# Patient Record
Sex: Male | Born: 1970 | Race: White | Hispanic: No | Marital: Married | Smoking: Never smoker
Health system: Southern US, Community
[De-identification: ages and names within clinical notes are randomized; demographics above are authoritative.]

## PROBLEM LIST (undated history)

## (undated) DIAGNOSIS — F32A Depression, unspecified: Secondary | ICD-10-CM

## (undated) DIAGNOSIS — G4730 Sleep apnea, unspecified: Secondary | ICD-10-CM

## (undated) DIAGNOSIS — T8859XA Other complications of anesthesia, initial encounter: Secondary | ICD-10-CM

## (undated) DIAGNOSIS — G43909 Migraine, unspecified, not intractable, without status migrainosus: Secondary | ICD-10-CM

## (undated) DIAGNOSIS — IMO0001 Reserved for inherently not codable concepts without codable children: Secondary | ICD-10-CM

## (undated) DIAGNOSIS — F329 Major depressive disorder, single episode, unspecified: Secondary | ICD-10-CM

## (undated) DIAGNOSIS — J189 Pneumonia, unspecified organism: Secondary | ICD-10-CM

## (undated) DIAGNOSIS — E162 Hypoglycemia, unspecified: Secondary | ICD-10-CM

## (undated) DIAGNOSIS — R0681 Apnea, not elsewhere classified: Secondary | ICD-10-CM

## (undated) DIAGNOSIS — M549 Dorsalgia, unspecified: Secondary | ICD-10-CM

## (undated) DIAGNOSIS — T7840XA Allergy, unspecified, initial encounter: Secondary | ICD-10-CM

## (undated) DIAGNOSIS — J329 Chronic sinusitis, unspecified: Secondary | ICD-10-CM

## (undated) DIAGNOSIS — T4145XA Adverse effect of unspecified anesthetic, initial encounter: Secondary | ICD-10-CM

## (undated) DIAGNOSIS — G8929 Other chronic pain: Secondary | ICD-10-CM

## (undated) DIAGNOSIS — E8801 Alpha-1-antitrypsin deficiency: Secondary | ICD-10-CM

## (undated) DIAGNOSIS — G2581 Restless legs syndrome: Secondary | ICD-10-CM

## (undated) DIAGNOSIS — T849XXA Unspecified complication of internal orthopedic prosthetic device, implant and graft, initial encounter: Principal | ICD-10-CM

## (undated) HISTORY — PX: SPINAL FUSION: SHX223

## (undated) HISTORY — DX: Restless legs syndrome: G25.81

## (undated) HISTORY — PX: CHOLECYSTECTOMY: SHX55

## (undated) HISTORY — DX: Reserved for inherently not codable concepts without codable children: IMO0001

## (undated) HISTORY — PX: LUMBAR FUSION: SHX111

## (undated) HISTORY — DX: Chronic sinusitis, unspecified: J32.9

## (undated) HISTORY — DX: Allergy, unspecified, initial encounter: T78.40XA

## (undated) HISTORY — PX: BACK SURGERY: SHX140

## (undated) HISTORY — DX: Apnea, not elsewhere classified: R06.81

## (undated) HISTORY — DX: Unspecified complication of internal orthopedic prosthetic device, implant and graft, initial encounter: T84.9XXA

---

## 1998-06-22 ENCOUNTER — Encounter: Admission: RE | Admit: 1998-06-22 | Discharge: 1998-09-20 | Payer: Self-pay | Admitting: Internal Medicine

## 1999-01-11 ENCOUNTER — Encounter: Admission: RE | Admit: 1999-01-11 | Discharge: 1999-04-11 | Payer: Self-pay | Admitting: Internal Medicine

## 2002-07-09 ENCOUNTER — Ambulatory Visit (HOSPITAL_BASED_OUTPATIENT_CLINIC_OR_DEPARTMENT_OTHER): Admission: RE | Admit: 2002-07-09 | Discharge: 2002-07-09 | Payer: Self-pay | Admitting: Otolaryngology

## 2004-04-30 ENCOUNTER — Ambulatory Visit: Payer: Self-pay | Admitting: Family Medicine

## 2006-11-14 ENCOUNTER — Telehealth (INDEPENDENT_AMBULATORY_CARE_PROVIDER_SITE_OTHER): Payer: Self-pay | Admitting: *Deleted

## 2006-11-21 ENCOUNTER — Ambulatory Visit: Payer: Self-pay | Admitting: Family Medicine

## 2006-11-21 ENCOUNTER — Telehealth (INDEPENDENT_AMBULATORY_CARE_PROVIDER_SITE_OTHER): Payer: Self-pay | Admitting: *Deleted

## 2006-11-21 DIAGNOSIS — J309 Allergic rhinitis, unspecified: Secondary | ICD-10-CM | POA: Insufficient documentation

## 2006-11-21 DIAGNOSIS — J45909 Unspecified asthma, uncomplicated: Secondary | ICD-10-CM | POA: Insufficient documentation

## 2006-11-21 LAB — CONVERTED CEMR LAB
ALT: 184 units/L — ABNORMAL HIGH (ref 0–53)
AST: 104 units/L — ABNORMAL HIGH (ref 0–37)
BUN: 13 mg/dL (ref 6–23)
Basophils Absolute: 0 10*3/uL (ref 0.0–0.1)
Calcium: 9.1 mg/dL (ref 8.4–10.5)
Eosinophils Absolute: 0.2 10*3/uL (ref 0.0–0.6)
GFR calc Af Amer: 74 mL/min
HCT: 47.7 % (ref 39.0–52.0)
Hemoglobin: 16.5 g/dL (ref 13.0–17.0)
Lymphocytes Relative: 39.3 % (ref 12.0–46.0)
MCHC: 34.6 g/dL (ref 30.0–36.0)
Mono Screen: NEGATIVE
Neutro Abs: 3.1 10*3/uL (ref 1.4–7.7)
Potassium: 4.3 meq/L (ref 3.5–5.1)
RBC: 5.59 M/uL (ref 4.22–5.81)
Sodium: 140 meq/L (ref 135–145)
TSH: 2.21 microintl units/mL (ref 0.35–5.50)
Total Bilirubin: 1.6 mg/dL — ABNORMAL HIGH (ref 0.3–1.2)
WBC: 6.9 10*3/uL (ref 4.5–10.5)

## 2006-11-22 ENCOUNTER — Telehealth: Payer: Self-pay | Admitting: Family Medicine

## 2007-03-26 ENCOUNTER — Encounter: Payer: Self-pay | Admitting: Internal Medicine

## 2007-04-18 ENCOUNTER — Encounter: Payer: Self-pay | Admitting: Internal Medicine

## 2007-09-19 ENCOUNTER — Ambulatory Visit: Payer: Self-pay | Admitting: Internal Medicine

## 2007-09-19 LAB — CONVERTED CEMR LAB
ALT: 44 units/L (ref 0–53)
Albumin: 3.9 g/dL (ref 3.5–5.2)
Alkaline Phosphatase: 91 units/L (ref 39–117)
Basophils Relative: 0 % (ref 0.0–3.0)
Bilirubin, Direct: 0.1 mg/dL (ref 0.0–0.3)
Calcium: 9.2 mg/dL (ref 8.4–10.5)
Cholesterol: 175 mg/dL (ref 0–200)
Creatinine, Ser: 1.2 mg/dL (ref 0.4–1.5)
GFR calc Af Amer: 88 mL/min
Glucose, Urine, Semiquant: NEGATIVE
Lymphocytes Relative: 39.3 % (ref 12.0–46.0)
MCHC: 34.6 g/dL (ref 30.0–36.0)
Monocytes Relative: 13.4 % — ABNORMAL HIGH (ref 3.0–12.0)
Neutro Abs: 2.6 10*3/uL (ref 1.4–7.7)
Neutrophils Relative %: 44.4 % (ref 43.0–77.0)
Protein, U semiquant: NEGATIVE
RBC: 5.15 M/uL (ref 4.22–5.81)
RDW: 12 % (ref 11.5–14.6)
Sodium: 140 meq/L (ref 135–145)
TSH: 1.25 microintl units/mL (ref 0.35–5.50)
Total Bilirubin: 1.6 mg/dL — ABNORMAL HIGH (ref 0.3–1.2)
Total CHOL/HDL Ratio: 6.1
Total Protein: 7.2 g/dL (ref 6.0–8.3)
Urobilinogen, UA: 1
VLDL: 17 mg/dL (ref 0–40)
WBC Urine, dipstick: NEGATIVE
WBC: 5.9 10*3/uL (ref 4.5–10.5)

## 2007-09-23 ENCOUNTER — Ambulatory Visit: Payer: Self-pay | Admitting: Internal Medicine

## 2007-09-23 DIAGNOSIS — E669 Obesity, unspecified: Secondary | ICD-10-CM

## 2007-10-21 ENCOUNTER — Telehealth: Payer: Self-pay | Admitting: Internal Medicine

## 2007-12-12 ENCOUNTER — Ambulatory Visit: Payer: Self-pay | Admitting: Internal Medicine

## 2007-12-12 DIAGNOSIS — G2581 Restless legs syndrome: Secondary | ICD-10-CM | POA: Insufficient documentation

## 2007-12-12 DIAGNOSIS — J329 Chronic sinusitis, unspecified: Secondary | ICD-10-CM | POA: Insufficient documentation

## 2008-03-06 ENCOUNTER — Ambulatory Visit: Payer: Self-pay | Admitting: Internal Medicine

## 2008-03-06 DIAGNOSIS — G4733 Obstructive sleep apnea (adult) (pediatric): Secondary | ICD-10-CM

## 2008-03-10 ENCOUNTER — Encounter: Payer: Self-pay | Admitting: Internal Medicine

## 2008-03-10 ENCOUNTER — Telehealth: Payer: Self-pay | Admitting: Internal Medicine

## 2008-03-16 ENCOUNTER — Telehealth: Payer: Self-pay | Admitting: Internal Medicine

## 2008-04-02 ENCOUNTER — Telehealth: Payer: Self-pay | Admitting: Internal Medicine

## 2008-04-07 ENCOUNTER — Ambulatory Visit: Payer: Self-pay | Admitting: Internal Medicine

## 2008-04-09 ENCOUNTER — Ambulatory Visit: Payer: Self-pay | Admitting: Cardiovascular Disease

## 2008-04-20 ENCOUNTER — Encounter: Payer: Self-pay | Admitting: Internal Medicine

## 2008-08-12 ENCOUNTER — Telehealth (INDEPENDENT_AMBULATORY_CARE_PROVIDER_SITE_OTHER): Payer: Self-pay | Admitting: *Deleted

## 2008-11-06 ENCOUNTER — Telehealth (INDEPENDENT_AMBULATORY_CARE_PROVIDER_SITE_OTHER): Payer: Self-pay | Admitting: *Deleted

## 2008-11-06 ENCOUNTER — Encounter: Admission: RE | Admit: 2008-11-06 | Discharge: 2008-11-06 | Payer: Self-pay | Admitting: Neurosurgery

## 2009-02-09 ENCOUNTER — Telehealth: Payer: Self-pay | Admitting: Internal Medicine

## 2009-02-10 ENCOUNTER — Ambulatory Visit: Payer: Self-pay | Admitting: Internal Medicine

## 2009-02-10 DIAGNOSIS — M5106 Intervertebral disc disorders with myelopathy, lumbar region: Secondary | ICD-10-CM

## 2009-02-10 DIAGNOSIS — J069 Acute upper respiratory infection, unspecified: Secondary | ICD-10-CM | POA: Insufficient documentation

## 2009-02-17 ENCOUNTER — Telehealth: Payer: Self-pay | Admitting: *Deleted

## 2009-02-17 ENCOUNTER — Ambulatory Visit: Payer: Self-pay | Admitting: Internal Medicine

## 2009-02-19 ENCOUNTER — Telehealth: Payer: Self-pay | Admitting: Internal Medicine

## 2009-04-08 ENCOUNTER — Inpatient Hospital Stay (HOSPITAL_COMMUNITY): Admission: RE | Admit: 2009-04-08 | Discharge: 2009-04-10 | Payer: Self-pay | Admitting: Neurosurgery

## 2009-08-05 ENCOUNTER — Encounter: Admission: RE | Admit: 2009-08-05 | Discharge: 2009-08-05 | Payer: Self-pay | Admitting: Neurosurgery

## 2009-08-18 ENCOUNTER — Encounter
Admission: RE | Admit: 2009-08-18 | Discharge: 2009-08-18 | Payer: Self-pay | Source: Home / Self Care | Admitting: Neurosurgery

## 2009-08-23 ENCOUNTER — Telehealth: Payer: Self-pay | Admitting: Internal Medicine

## 2009-09-22 ENCOUNTER — Telehealth: Payer: Self-pay | Admitting: Internal Medicine

## 2009-10-26 ENCOUNTER — Telehealth: Payer: Self-pay | Admitting: Internal Medicine

## 2010-02-02 ENCOUNTER — Encounter
Admission: RE | Admit: 2010-02-02 | Discharge: 2010-02-02 | Payer: Self-pay | Source: Home / Self Care | Attending: Specialist | Admitting: Specialist

## 2010-02-18 ENCOUNTER — Telehealth: Payer: Self-pay | Admitting: Internal Medicine

## 2010-02-21 ENCOUNTER — Telehealth: Payer: Self-pay | Admitting: Internal Medicine

## 2010-02-24 ENCOUNTER — Telehealth: Payer: Self-pay | Admitting: Internal Medicine

## 2010-03-02 LAB — COMPREHENSIVE METABOLIC PANEL
AST: 45 U/L — ABNORMAL HIGH (ref 0–37)
Alkaline Phosphatase: 87 U/L (ref 39–117)
Calcium: 9.2 mg/dL (ref 8.4–10.5)
Glucose, Bld: 85 mg/dL (ref 70–99)
Potassium: 5.8 mEq/L — ABNORMAL HIGH (ref 3.5–5.1)
Total Bilirubin: 1 mg/dL (ref 0.3–1.2)

## 2010-03-02 LAB — CBC
HCT: 47.4 % (ref 39.0–52.0)
MCH: 29.5 pg (ref 26.0–34.0)
MCHC: 33.5 g/dL (ref 30.0–36.0)
MCV: 87.9 fL (ref 78.0–100.0)
Platelets: 207 10*3/uL (ref 150–400)

## 2010-03-02 LAB — SURGICAL PCR SCREEN: Staphylococcus aureus: POSITIVE — AB

## 2010-03-02 LAB — TYPE AND SCREEN
ABO/RH(D): A POS
Antibody Screen: NEGATIVE

## 2010-03-03 ENCOUNTER — Inpatient Hospital Stay (HOSPITAL_COMMUNITY)
Admission: RE | Admit: 2010-03-03 | Discharge: 2010-03-09 | DRG: 460 | Disposition: A | Discharge status: Rehabilitation Facility | Payer: Worker's Compensation | Attending: Neurosurgery | Admitting: Neurosurgery

## 2010-03-03 DIAGNOSIS — M51379 Other intervertebral disc degeneration, lumbosacral region without mention of lumbar back pain or lower extremity pain: Secondary | ICD-10-CM | POA: Diagnosis present

## 2010-03-03 DIAGNOSIS — E8801 Alpha-1-antitrypsin deficiency: Secondary | ICD-10-CM | POA: Diagnosis present

## 2010-03-03 DIAGNOSIS — J45909 Unspecified asthma, uncomplicated: Secondary | ICD-10-CM | POA: Diagnosis present

## 2010-03-03 DIAGNOSIS — E669 Obesity, unspecified: Secondary | ICD-10-CM | POA: Diagnosis present

## 2010-03-03 DIAGNOSIS — M47817 Spondylosis without myelopathy or radiculopathy, lumbosacral region: Principal | ICD-10-CM | POA: Diagnosis present

## 2010-03-03 DIAGNOSIS — M5137 Other intervertebral disc degeneration, lumbosacral region: Secondary | ICD-10-CM | POA: Diagnosis present

## 2010-03-03 DIAGNOSIS — Z79899 Other long term (current) drug therapy: Secondary | ICD-10-CM

## 2010-03-03 DIAGNOSIS — Z23 Encounter for immunization: Secondary | ICD-10-CM

## 2010-03-03 DIAGNOSIS — G4733 Obstructive sleep apnea (adult) (pediatric): Secondary | ICD-10-CM | POA: Diagnosis present

## 2010-03-03 LAB — COMPREHENSIVE METABOLIC PANEL
ALT: 69 U/L — ABNORMAL HIGH (ref 0–53)
Albumin: 4.1 g/dL (ref 3.5–5.2)
Alkaline Phosphatase: 92 U/L (ref 39–117)
CO2: 29 mEq/L (ref 19–32)
Chloride: 106 mEq/L (ref 96–112)
Glucose, Bld: 93 mg/dL (ref 70–99)
Potassium: 4.4 mEq/L (ref 3.5–5.1)

## 2010-03-03 LAB — PROTIME-INR
INR: 1.06 (ref 0.00–1.49)
Prothrombin Time: 14 seconds (ref 11.6–15.2)

## 2010-03-09 ENCOUNTER — Inpatient Hospital Stay (HOSPITAL_COMMUNITY)
Admission: RE | Admit: 2010-03-09 | Discharge: 2010-03-12 | DRG: 946 | Disposition: A | Discharge status: Home / Self Care | Payer: Worker's Compensation | Source: Other Acute Inpatient Hospital | Attending: Physical Medicine & Rehabilitation | Admitting: Physical Medicine & Rehabilitation

## 2010-03-09 DIAGNOSIS — G43909 Migraine, unspecified, not intractable, without status migrainosus: Secondary | ICD-10-CM

## 2010-03-09 DIAGNOSIS — M47817 Spondylosis without myelopathy or radiculopathy, lumbosacral region: Secondary | ICD-10-CM

## 2010-03-09 DIAGNOSIS — IMO0002 Reserved for concepts with insufficient information to code with codable children: Secondary | ICD-10-CM

## 2010-03-09 DIAGNOSIS — F341 Dysthymic disorder: Secondary | ICD-10-CM

## 2010-03-09 DIAGNOSIS — Z5189 Encounter for other specified aftercare: Principal | ICD-10-CM

## 2010-03-09 DIAGNOSIS — Z981 Arthrodesis status: Secondary | ICD-10-CM

## 2010-03-09 DIAGNOSIS — G4733 Obstructive sleep apnea (adult) (pediatric): Secondary | ICD-10-CM

## 2010-03-09 DIAGNOSIS — G8918 Other acute postprocedural pain: Secondary | ICD-10-CM

## 2010-03-10 DIAGNOSIS — M47817 Spondylosis without myelopathy or radiculopathy, lumbosacral region: Secondary | ICD-10-CM

## 2010-03-10 DIAGNOSIS — F341 Dysthymic disorder: Secondary | ICD-10-CM

## 2010-03-10 DIAGNOSIS — IMO0002 Reserved for concepts with insufficient information to code with codable children: Secondary | ICD-10-CM

## 2010-03-10 LAB — COMPREHENSIVE METABOLIC PANEL
AST: 34 U/L (ref 0–37)
Albumin: 2.7 g/dL — ABNORMAL LOW (ref 3.5–5.2)
BUN: 12 mg/dL (ref 6–23)
Calcium: 8.7 mg/dL (ref 8.4–10.5)
Chloride: 97 mEq/L (ref 96–112)
Creatinine, Ser: 1.33 mg/dL (ref 0.4–1.5)
GFR calc Af Amer: >60 mL/min (ref >60)
Total Bilirubin: 0.5 mg/dL (ref 0.3–1.2)

## 2010-03-10 LAB — CBC
MCH: 29.1 pg (ref 26.0–34.0)
MCHC: 32.9 g/dL (ref 30.0–36.0)
MCV: 88.2 fL (ref 78.0–100.0)
Platelets: 249 10*3/uL (ref 150–400)
RBC: 4.75 MIL/uL (ref 4.22–5.81)
RDW: 13 % (ref 11.5–15.5)

## 2010-03-10 LAB — DIFFERENTIAL
Basophils Absolute: 0.1 10*3/uL (ref 0.0–0.1)
Eosinophils Relative: 7 % — ABNORMAL HIGH (ref 0–5)
Lymphs Abs: 2.6 10*3/uL (ref 0.7–4.0)

## 2010-03-10 NOTE — Assessment & Plan Note (Signed)
Summary: uri/bmw   Vital Signs:  Patient profile:   40 year old male Height:      72 inches Weight:      343 pounds BMI:     46.69 Temp:     98.2 degrees F oral Pulse rate:   80 / minute Resp:     14 per minute BP sitting:   110 / 80  (left arm) Cuff size:   large  Vitals Entered By: Willy Eddy, LPN (February 17, 2009 1:00 PM)  Nutrition Counseling: Patient's BMI is greater than 25 and therefore counseled on weight management options. CC: cont to c/o cough and congestion and feeling worse- chills and fever-conintues on meds given at last ov   CC:  cont to c/o cough and congestion and feeling worse- chills and fever-conintues on meds given at last ov.  History of Present Illness: THE PT HAS BEEN SOB WITH A COLD AND COUGH THAT AHS SETTLED INTO HIS CHEST THE COUGH IS NONPRODUCTION BUT HE IS BLOWING PURULENT MATERIAL FROM HIS NOSE. HIS DAUGHTER IS ILL WITH A URI. RISKS ARE APNEA, ALLERGIES, OBESITY CXR WAS NEGATIVE. 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 One By Mouth Q Hzs 2)  Astepro 137 Mcg/spray Soln (Azelastine Hcl) .... Two Spray Two Times A Day 3)  Nasacort Aq 55 Mcg/act Aers (Triamcinolone Acetonide(Nasal)) .... Use Daily 4)  Fexofenadine Hcl 180 Mg  Tabs (Fexofenadine Hcl) .... One Daily 5)  Clonazepam 1 Mg Tabs (Clonazepam) .... One By Mouth Q Hs For Sleep 6)  Singulair 10 Mg Tabs (Montelukast Sodium) .... One By Mouth Daily 7)  Zithromax Z-Pak 250 Mg Tabs (Azithromycin) .... Use As Directed 8)  Allegra-D 12 Hour 60-120 Mg Xr12h-Tab (Fexofenadine-Pseudoephedrine) .... One Tablet Two Times A Day 9)  Sulfamethoxazole-Tmp Ds 800-160 Mg Tabs (Sulfamethoxazole-Trimethoprim) .... One By Mouth Two Times A Day 10)  Respivent-D 120-2.5 Mg Xr12h-Tab (Pseudoephedrine-Methscopolamin) .... One By Mouth Two Times A Day For 10 Days  Current Medications (verified): 1)  Mirapex 0.5 Mg Tabs (Pramipexole Dihydrochloride) .... One By Mouth Q Hzs 2)  Astepro 137 Mcg/spray Soln (Azelastine Hcl) .... Two Spray Two Times A Day 3)  Nasacort Aq 55 Mcg/act Aers (Triamcinolone Acetonide(Nasal)) .... Use Daily 4)  Fexofenadine Hcl 180 Mg Tabs (Fexofenadine Hcl) .... One Daily 5)  Clonazepam 1 Mg Tabs (Clonazepam) .... One By Mouth Q Hs For Sleep 6)  Singulair 10 Mg Tabs (Montelukast Sodium) .... One By Mouth Daily 7)  Zithromax Z-Pak 250 Mg Tabs (Azithromycin) .... Use As Directed 8)  Allegra-D 12 Hour 60-120 Mg Xr12h-Tab (Fexofenadine-Pseudoephedrine) .... One Tablet Two Times A Day 9)  Sulfamethoxazole-Tmp Ds 800-160 Mg Tabs (Sulfamethoxazole-Trimethoprim) .... One By Mouth Two Times A Day 10)  Respivent-D 120-2.5 Mg Xr12h-Tab (Pseudoephedrine-Methscopolamin) .... One  By Mouth Two Times A Day For 10 Days 11)  Avelox 400 Mg Tabs (Moxifloxacin Hcl) .... One By Mouth Daily 12)  Respivent-D 120-2.5 Mg Xr12h-Tab (Pseudoephedrine-Methscopolamin) .... One By Mouth Two Times A Day For 10 Days ( May Sub) 13)  Hydromet 5-1.5 Mg/55ml Syrp (Hydrocodone-Homatropine) .... Two Tsp By Mouth Q 6 Hours  Allergies (verified): No Known Drug Allergies  Past History:  Family History: Last updated: 09/23/2007 Family History of CAD Male 1st degree relative <50 Family History High  cholesterol Family History Hypertension  Social History: Last updated: 04/07/2008 Married Never Smoked  Risk Factors: Exercise: no (09/23/2007)  Risk Factors: Smoking Status: never (02/17/2009) Passive Smoke Exposure: no (02/17/2009)  Past medical, surgical, family and social histories (including risk factors) reviewed, and no changes noted (except as noted below).  Past Medical History: Reviewed history from 04/07/2008 and no changes required. Allergic rhinitis Asthma chronic sinusitis sleep apnea restless legs  Past Surgical History: Reviewed history from 04/07/2008 and no changes required. Cholecystectomy 1999  Family History: Reviewed history from 09/23/2007 and no changes required. Family History of CAD Male 1st degree relative <50 Family History High cholesterol Family History Hypertension  Social History: Reviewed history from 04/07/2008 and no changes required. Married Never Smoked  Review of Systems  The patient denies anorexia, fever, weight loss, weight gain, vision loss, decreased hearing, hoarseness, chest pain, syncope, dyspnea on exertion, peripheral edema, prolonged cough, headaches, hemoptysis, abdominal pain, melena, hematochezia, severe indigestion/heartburn, hematuria, incontinence, genital sores, muscle weakness, suspicious skin lesions, transient blindness, difficulty walking, depression, unusual weight change, abnormal bleeding, enlarged lymph nodes, angioedema, breast masses, and testicular masses.    Physical Exam  General:  overweight-appearing.  alert.   Head:  normocephalic, atraumatic, and male-pattern balding.   Eyes:  pupils equal and pupils round.   Ears:  R ear normal and L ear normal.   Nose:  mucosal erythema, mucosal edema, and airflow obstruction.   Mouth:  pharyngeal exudate, posterior lymphoid hypertrophy, and postnasal drip.   Lungs:  R base dullness, R wheezes, L decreased breath sounds, and L wheezes.   Heart:  normal rate  and tachycardia.   Abdomen:  Bowel sounds positive,abdomen soft and non-tender without masses, organomegaly or hernias noted.   Impression & Recommendations:  Problem # 1:  PNEUMONIA, ATYPICAL (ICD-486)  His updated medication list for this problem includes:    Zithromax Z-pak 250 Mg Tabs (Azithromycin) ..... Use as directed    Sulfamethoxazole-tmp Ds 800-160 Mg Tabs (Sulfamethoxazole-trimethoprim) ..... One by mouth two times a day    Avelox 400 Mg Tabs (Moxifloxacin hcl) ..... One by mouth daily  Instructed patient to complete antibiotics, and call for worsened shortness of breath or new  DEPO MEDROL 120 im NOW SAMPLES OF ALBUTEROL  Orders: Admin of Therapeutic Inj  intramuscular or subcutaneous ([address]) Depo- Medrol 80mg  (J1040)  Complete Medication List: 1)  Mirapex 0.5 Mg Tabs (Pramipexole dihydrochloride) .... One by mouth q hzs 2)  Astepro 137 Mcg/spray Soln (Azelastine hcl) .... Two spray two times a day 3)  Nasacort Aq 55 Mcg/act Aers (Triamcinolone acetonide(nasal)) .... Use daily 4)  Fexofenadine Hcl 180 Mg Tabs (Fexofenadine hcl) .... One daily 5)  Clonazepam 1 Mg Tabs (Clonazepam) .... One by mouth q hs for sleep 6)  Singulair 10 Mg Tabs (Montelukast sodium) .... One by mouth daily 7)  Zithromax Z-pak 250 Mg Tabs (Azithromycin) .... Use as directed 8)  Allegra-d 12 Hour 60-120 Mg Xr12h-tab (Fexofenadine-pseudoephedrine) .... One tablet two times a  day 9)  Sulfamethoxazole-tmp Ds 800-160 Mg Tabs (Sulfamethoxazole-trimethoprim) .... One by mouth two times a day 10)  Respivent-d 120-2.5 Mg Xr12h-tab (Pseudoephedrine-methscopolamin) .... One by mouth two times a day for 10 days 11)  Avelox 400 Mg Tabs (Moxifloxacin hcl) .... One by mouth daily 12)  Respivent-d 120-2.5 Mg Xr12h-tab (Pseudoephedrine-methscopolamin) .... One by mouth two times a day for 10 days ( may sub) 13)  Hydromet 5-1.5 Mg/63ml Syrp (Hydrocodone-homatropine) .... Two tsp by mouth q 6 hours  Patient  Instructions: 1)  TAKE ALL 10 DAYSOF AVELOX 2)  USE THE ALBUTEROL INHAILER TWO PUFF three times a day 3)  CALL REPORT ON STATIS BY FRIDAY Prescriptions: HYDROMET 5-1.5 MG/5ML SYRP (HYDROCODONE-HOMATROPINE) TWO TSP by mouth Q 6 HOURS  #6 OZ x 1   Entered and Authorized by:   Stacie Glaze MD   Signed by:   Stacie Glaze MD on 02/17/2009   Method used:   Printed then faxed to ...       [facility] [id number]       [address]       [place], [place]  [address]       Ph: [phone] or [phone]       Fax: [phone]   RxID:   [id number] RESPIVENT-D 120-2.5 MG XR12H-TAB (PSEUDOEPHEDRINE-METHSCOPOLAMIN) ONE by mouth two times a day FOR 10 DAYS ( MAY SUB)  #20 x 0   Entered and Authorized by:   Stacie Glaze MD   Signed by:   Stacie Glaze MD on 02/17/2009   Method used:   Electronically [facility]        [facility] (retail)       [address]       [place], [place]  [address]       Ph: [phone] or [phone]       Fax: [phone]   RxID:   [id number] AVELOX 400 MG TABS (MOXIFLOXACIN HCL) ONE by mouth DAILY  #7 x 0   Entered and Authorized by:   Stacie Glaze MD   Signed by:   Stacie Glaze MD on 02/17/2009   Method used:   [facility]        [facility] [id number]       [address]       [place], [place]  [address]       Ph: [phone] or [phone]       Fax: [phone]   RxID:   [id number]    Medication Administration  Injection # 1:    Medication: Depo- Medrol 80mg     Diagnosis: PNEUMONIA, ATYPICAL (ICD-486)    Route: IM    Site: LUOQ gluteus    Exp Date: 11/16/2009    Lot #: [id number]    Given by: Stacie Glaze MD (February 17, 2009 1:36 PM)  Orders Added: 1)  Est. Patient Level IV [71245] 2)  Admin of Therapeutic Inj  intramuscular or subcutaneous [96372] 3)  Depo- Medrol 80mg  [J1040]

## 2010-03-10 NOTE — Progress Notes (Signed)
  Phone Note Call from Patient Call back at Home Phone [phone]   Caller: Patient Call For: Stacie Glaze MD Summary of Call: Pt is asking for a Zpac for chest congestion and cough. [facility] [place] Initial call taken by: Lynann Beaver CMA AAMA,  February 18, 2010 1:43 PM  Follow-up for Phone Call        may have z pack per dr Lovell Sheehan Follow-up by: Willy Eddy, LPN,  February 18, 2010 3:44 PM    New/Updated Medications: ZITHROMAX 250 MG TABS (AZITHROMYCIN) as directed Prescriptions: ZITHROMAX 250 MG TABS (AZITHROMYCIN) as directed  #6 x 0   Entered by:   Lynann Beaver CMA AAMA   Authorized by:   Stacie Glaze MD   Signed by:   Lynann Beaver CMA AAMA on 02/18/2010   Method used:   Electronically to        [facility] (retail)       [address]       [place], [place]  [address]       Ph: [phone] or [phone]       Fax: [phone]   RxID:   [id number]  Notified pt.

## 2010-03-10 NOTE — Progress Notes (Signed)
Summary: feels worse?  Phone Note Call from Patient   Caller: Patient Call For: Stacie Glaze MD Summary of Call: Pt is feeling worse with bilateral ear pain, laryngitis, sore throat, chest hurts through to back, fever, and chills, coughing constantly. [phone] Initial call taken by: Lynann Beaver CMA,  February 17, 2009 10:33 AM  Follow-up for Phone Call        -pt informedcxr per dr Bess Kinds and ov after Follow-up by: Willy Eddy, LPN,  February 17, 2009 10:39 AM

## 2010-03-10 NOTE — Progress Notes (Signed)
Summary: REQ FOR MED DOSAGE INCREASE / RETURN CALL  Phone Note Call from Patient Call back at Work Phone [phone]   Caller: Patient  [phone] Summary of Call: Pt called in to speak with [facility], LPN.... Pt states that Dr Murray Hodgkins (pain mgmnt physician) in with Dr Gerlene Fee has told pt that he needs to have his med dosage for Cymbalta increased to help with his depression.... Pt has one pill left and is requesting a return call to discuss.... Pt can be reached at [phone]   or    [phone]. vm Wants [place] to call.  [phone]  [facility]. Raelene Bott Spell, RN  September 22, 2009 4:06 PM   Initial call taken by: Debbra Riding,  September 22, 2009 4:00 PM    New/Updated Medications: CYMBALTA 60 MG CPEP (DULOXETINE HCL) 1 once daily Prescriptions: CYMBALTA 60 MG CPEP (DULOXETINE HCL) 1 once daily  #30 x 0   Entered by:   Willy Eddy, LPN   Authorized by:   Stacie Glaze MD   Signed by:   Willy Eddy, LPN on 25/36/6440   Method used:   Electronically to        [facility] (retail)       [address]       [place], [place]  [address]       Ph: [phone] or [phone]       Fax: [phone]   RxID:   [id number]

## 2010-03-10 NOTE — Progress Notes (Signed)
Summary: inhaler helps about 30 minutes  Phone Note Call from Patient Call back at Home Phone [phone]   Call For: jenkins Summary of Call: Feel like somebody's beaten me in chest with a sledge hammer, can hardly breathe, havind trouble catching breath, sometime's okay, headache.  Started meds given Wed.  T99 last time checked.  On 600mg  Ibuprofen.  [facility]  [facility].   Initial call taken by: Rudy Jew, RN,  February 19, 2009 8:21 AM  Follow-up for Phone Call        per dr Lovell Sheehan- this may have gone into the flu and it is too late to take flu medication- will have to treat the signs and may take 1-2 weeks for recovery -- will call in medrol 4 mg dose pack and this may help Follow-up by: Willy Eddy, LPN,  February 19, 2009 9:13 AM  Additional Follow-up for Phone Call Additional follow up Details #1::        Will get started on the Medrol.  Wants Dr. Shela Commons to know that the inhaler he gave him 2 puffs three times a day helps about 30 minutes then he has trouble breathing again.   This feeling starts in his chest, not nasal.   Additional Follow-up by: Rudy Jew, RN,  February 19, 2009 9:56 AM    Additional Follow-up for Phone Call Additional follow up Details #2::    well taking the medrol dose pack she h elp Follow-up by: Willy Eddy, LPN,  February 19, 2009 10:10 AM  Additional Follow-up for Phone Call Additional follow up Details #3:: Details for Additional Follow-up Action Taken: Patient advised.  Says he was on CPAP all night & could breathe through his nose.   Given Sat clinic info & to ER for emergency or [facility] for non emergency care over weekend.   Additional Follow-up by: Rudy Jew, RN,  February 19, 2009 11:45 AM  New/Updated Medications: MEDROL (PAK) 4 MG TABS (METHYLPREDNISOLONE) As directed Prescriptions: MEDROL (PAK) 4 MG TABS (METHYLPREDNISOLONE) As directed  #1 x 0   Entered by:   Rudy Jew, RN   Authorized by:   Stacie Glaze MD   Signed by:   Rudy Jew, RN on 02/19/2009   Method used:   Electronically to        [facility] (retail)       [address]       [place], [place]  [address]       Ph: [phone] or [phone]       Fax: [phone]   RxID:   [id number]

## 2010-03-10 NOTE — Assessment & Plan Note (Signed)
Summary: uri/dm   Vital Signs:  Patient profile:   40 year old male Height:      72 inches Weight:      348 pounds BMI:     47.37 Temp:     98.2 degrees F oral Pulse rate:   76 / minute Resp:     14 per minute BP sitting:   120 / 80  (left arm) Cuff size:   large  Vitals Entered By: Willy Eddy, LPN (February 10, 2009 11:54 AM) CC: c/o conestion with cough, URI symptoms   CC:  c/o conestion with cough and URI symptoms.  History of Present Illness: hads back surgery [place] for ruptured disc with mylopathy  URI Symptoms      This is a 40 year old man who presents with URI symptoms.  The patient reports nasal congestion, clear nasal discharge, sore throat, and productive cough, but denies purulent nasal discharge, dry cough, earache, and sick contacts.  Associated symptoms include low-grade fever (<100.5 degrees).  The patient denies fever, fever of 100.5-103 degrees, fever of 103.1-104 degrees, fever to >104 degrees, stiff neck, dyspnea, wheezing, rash, vomiting, diarrhea, use of an antipyretic, and response to antipyretic.  The patient also reports headache, muscle aches, and severe fatigue.    Preventive Screening-Counseling & Management  Alcohol-Tobacco     Smoking Status: never     Passive Smoke Exposure: no  Problems Prior to Update: 1)  Sleep Apnea, Obstructive  (ICD-327.23) 2)  Sinusitis, Chronic  (ICD-473.9) 3)  Restless Leg Syndrome, Severe  (ICD-333.94) 4)  Preventive Health Care  (ICD-V70.0) 5)  Obesity  (ICD-278.00) 6)  Family History of Cad Male 1st Degree Relative <50  (ICD-V17.3) 7)  Physical Examination  (ICD-V70.0) 8)  Fuo  (ICD-780.6) 9)  Asthma  (ICD-493.90) 10)  Allergic Rhinitis  (ICD-477.9)  Medications Prior to Update: 1)  Mirapex 0.5 Mg Tabs (Pramipexole Dihydrochloride) .... One By Mouth Q Hzs 2)  Astepro 137 Mcg/spray Soln (Azelastine Hcl) .... Two Spray Two Times A Day 3)  Nasacort Aq 55 Mcg/act Aers (Triamcinolone Acetonide(Nasal))  .... Use Daily 4)  Fexofenadine Hcl 180 Mg Tabs (Fexofenadine Hcl) .... One Daily 5)  Clonazepam 1 Mg Tabs (Clonazepam) .... One By Mouth Q Hs For Sleep 6)  Singulair 10 Mg Tabs (Montelukast Sodium) .... One By Mouth Daily 7)  Zithromax Z-Pak 250 Mg Tabs (Azithromycin) .... Use As Directed 8)  Allegra-D 12 Hour 60-120 Mg Xr12h-Tab (Fexofenadine-Pseudoephedrine) .... One Tablet Two Times A Day  Current Medications (verified): 1)  Mirapex 0.5 Mg Tabs (Pramipexole Dihydrochloride) .... One By Mouth Q Hzs 2)  Astepro 137 Mcg/spray Soln (Azelastine Hcl) .... Two Spray Two Times A Day 3)  Nasacort Aq 55 Mcg/act Aers (Triamcinolone Acetonide(Nasal)) .... Use Daily 4)  Fexofenadine Hcl 180 Mg Tabs (Fexofenadine Hcl) .... One Daily 5)  Clonazepam 1 Mg Tabs (Clonazepam) .... One By Mouth Q Hs For Sleep 6)  Singulair 10 Mg Tabs (Montelukast Sodium) .... One By Mouth Daily 7)  Zithromax Z-Pak 250 Mg Tabs (Azithromycin) .... Use As Directed 8)  Allegra-D 12 Hour 60-120 Mg Xr12h-Tab (Fexofenadine-Pseudoephedrine) .... One Tablet Two Times A Day  Allergies (verified): No Known Drug Allergies  Past History:  Family History: Last updated: 09/23/2007 Family History of CAD Male 1st degree relative <50 Family History High cholesterol Family History Hypertension  Social History: Last updated: 04/07/2008 Married Never Smoked  Risk Factors: Exercise: no (09/23/2007)  Risk Factors: Smoking Status: never (02/10/2009) Passive Smoke Exposure:  no (02/10/2009)  Past medical, surgical, family and social histories (including risk factors) reviewed, and no changes noted (except as noted below).  Past Medical History: Reviewed history from 04/07/2008 and no changes required. Allergic rhinitis Asthma chronic sinusitis sleep apnea restless legs  Past Surgical History: Reviewed history from 04/07/2008 and no changes required. Cholecystectomy 1999  Family History: Reviewed history from 09/23/2007  and no changes required. Family History of CAD Male 1st degree relative <50 Family History High cholesterol Family History Hypertension  Social History: Reviewed history from 04/07/2008 and no changes required. Married Never Smoked  Review of Systems  The patient denies anorexia, fever, weight loss, weight gain, vision loss, decreased hearing, hoarseness, chest pain, syncope, dyspnea on exertion, peripheral edema, prolonged cough, headaches, hemoptysis, abdominal pain, melena, hematochezia, severe indigestion/heartburn, hematuria, incontinence, genital sores, muscle weakness, suspicious skin lesions, transient blindness, difficulty walking, depression, unusual weight change, abnormal bleeding, enlarged lymph nodes, angioedema, breast masses, and testicular masses.    Physical Exam  General:  overweight-appearing.  alert.   Head:  normocephalic, atraumatic, and male-pattern balding.   Eyes:  pupils equal and pupils round.   Ears:  R ear normal and L ear normal.   Nose:  mucosal erythema, mucosal edema, and airflow obstruction.   Mouth:  pharyngeal exudate, posterior lymphoid hypertrophy, and postnasal drip.   Lungs:  Normal respiratory effort, chest expands symmetrically. Lungs are clear to auscultation, no crackles or wheezes. Heart:  normal rate and regular rhythm.   Abdomen:  Bowel sounds positive,abdomen soft and non-tender without masses, organomegaly or hernias noted.   Impression & Recommendations:  Problem # 1:  DEGENERATIVE DISC DISEASE, LUMBAR SPINE, WITH MYELOPATHY (ICD-722.73) has surgery planned with workmand comp for spinal fusion with Kritzer  Problem # 2:  OBESITY, UNSPECIFIED (ICD-278.00) will discuss the use of the appetitie supressants after the back surgery Ht: 72 (02/10/2009)   Wt: 348 (02/10/2009)   BMI: 47.37 (02/10/2009)  Problem # 3:  URI (ICD-465.9) this is on the verge of a sinus infecfion of the left will rx with anitbotic His updated medication list  for this problem includes:    Fexofenadine Hcl 180 Mg Tabs (Fexofenadine hcl) ..... One daily    Allegra-d 12 Hour 60-120 Mg Xr12h-tab (Fexofenadine-pseudoephedrine) ..... One tablet two times a day    Respivent-d 120-2.5 Mg Xr12h-tab (Pseudoephedrine-methscopolamin) ..... One by mouth two times a day for 10 days  Instructed on symptomatic treatment. Call if symptoms persist or worsen.   Complete Medication List: 1)  Mirapex 0.5 Mg Tabs (Pramipexole dihydrochloride) .... One by mouth q hzs 2)  Astepro 137 Mcg/spray Soln (Azelastine hcl) .... Two spray two times a day 3)  Nasacort Aq 55 Mcg/act Aers (Triamcinolone acetonide(nasal)) .... Use daily 4)  Fexofenadine Hcl 180 Mg Tabs (Fexofenadine hcl) .... One daily 5)  Clonazepam 1 Mg Tabs (Clonazepam) .... One by mouth q hs for sleep 6)  Singulair 10 Mg Tabs (Montelukast sodium) .... One by mouth daily 7)  Zithromax Z-pak 250 Mg Tabs (Azithromycin) .... Use as directed 8)  Allegra-d 12 Hour 60-120 Mg Xr12h-tab (Fexofenadine-pseudoephedrine) .... One tablet two times a day 9)  Sulfamethoxazole-tmp Ds 800-160 Mg Tabs (Sulfamethoxazole-trimethoprim) .... One by mouth two times a day 10)  Respivent-d 120-2.5 Mg Xr12h-tab (Pseudoephedrine-methscopolamin) .... One by mouth two times a day for 10 days  Patient Instructions: 1)  after the surgery schedule  a  CPX Prescriptions: RESPIVENT-D 120-2.5 MG XR12H-TAB (PSEUDOEPHEDRINE-METHSCOPOLAMIN) one by mouth two times a day for 10 days  #  20 x 1   Entered and Authorized by:   Stacie Glaze MD   Signed by:   Stacie Glaze MD on 02/10/2009   Method used:   Electronically to        [facility] (retail)       [address]       [place], [place]  [address]       Ph: [phone] or [phone]       Fax: [phone]   RxID:   [id number] SULFAMETHOXAZOLE-TMP DS 800-160 MG TABS (SULFAMETHOXAZOLE-TRIMETHOPRIM) one by mouth two times a day  #20 x 0   Entered and Authorized by:    Stacie Glaze MD   Signed by:   Stacie Glaze MD on 02/10/2009   Method used:   [facility]ronically to        [facility] (retail)       [address]       [place], [place]  [address]       Ph: [phone] or [phone]       Fax: [phone]   RxID:   [id number]

## 2010-03-10 NOTE — Progress Notes (Signed)
  Phone Note Call from Patient   Caller: Patient Call For: Stacie Glaze MD Reason for Call: Acute Illness Summary of Call: Still congested, and wants Dr. Lovell Sheehan to give him an antibiotic.  [phone] Initial call taken by: Lynann Beaver CMA AAMA,  February 24, 2010 1:24 PM  Follow-up for Phone Call        gave him z pack which is an antibioitc - per dr Lovell Sheehan -needs ov for anymore medication Follow-up by: Willy Eddy, LPN,  February 24, 2010 2:11 PM  Additional Follow-up for Phone Call Additional follow up Details #1::        Pt will call back if he wants an appt. Additional Follow-up by: Lynann Beaver CMA AAMA,  February 24, 2010 2:24 PM

## 2010-03-10 NOTE — Progress Notes (Signed)
  Phone Note Call from Patient   Caller: Patient Call For: Stacie Glaze MD Reason for Call: Acute Illness Complaint: Cough/Sore throat Summary of Call: Pt needs a good cough med, please. Call to [facility])  Wants Codeine in it. Initial call taken by: Lynann Beaver CMA AAMA,  February 21, 2010 9:14 AM  Follow-up for Phone Call        per dr Lovell Sheehan- may have hydromet 6 oz 1 tsp every 4 hours as needed cough Follow-up by: Willy Eddy, LPN,  February 21, 2010 9:57 AM    New/Updated Medications: HYDROMET 5-1.5 MG/5ML SYRP (HYDROCODONE-HOMATROPINE) one teaspoon q 4-6 hrs as needed cough Prescriptions: HYDROMET 5-1.5 MG/5ML SYRP (HYDROCODONE-HOMATROPINE) one teaspoon q 4-6 hrs as needed cough  #6oz x 0   Entered by:   Lynann Beaver CMA AAMA   Authorized by:   Birdie Sons MD   Signed by:   Lynann Beaver CMA AAMA on 02/21/2010   Method used:   Telephoned to ...       [facility] (retail)       [address]       [place], [place]  [address]       Ph: [phone] or [phone]       Fax: [phone]   RxID:   [id number]

## 2010-03-10 NOTE — Progress Notes (Signed)
Summary: Deppression  Phone Note Call from Patient Call back at Home Phone [phone]   Caller: Patient Reason for Call: Acute Illness Summary of Call: Seeing Dr.Fritzer(?) for spinal injury at work.  Now I feel like I am getting really depressed, was told to call PCP about depression. Initial call taken by: Trixie Dredge,  August 23, 2009 4:29 PM  Follow-up for Phone Call        pt needs ov-Called patient and left message on machine  Follow-up by: Willy Eddy, LPN,  August 23, 2009 4:55 PM  Additional Follow-up for Phone Call Additional follow up Details #1::        may start cymbalta 30 and can see dr Lovell Sheehan next month Additional Follow-up by: Willy Eddy, LPN,  August 24, 2009 1:23 PM    New/Updated Medications: CYMBALTA 30 MG CPEP (DULOXETINE HCL) 1 once daily Prescriptions: CYMBALTA 30 MG CPEP (DULOXETINE HCL) 1 once daily  #30 x 3   Entered by:   Willy Eddy, LPN   Authorized by:   Stacie Glaze MD   Signed by:   Willy Eddy, LPN on 09/81/1914   Method used:   Electronically to        [facility] (retail)       [address]       [place], [place]  [address]       Ph: [phone] or [phone]       Fax: [phone]   RxID:   [id number]

## 2010-03-10 NOTE — Progress Notes (Signed)
Summary: URI  Phone Note Call from Patient   Caller: Patient Call For: Stacie Glaze MD Reason for Call: Refill Medication Summary of Call: JPt is complainiing of headache, non productive cough, runny nose, fatigue, and no fever.  [facility] RX. [facility] [phone] Initial call taken by: Lynann Beaver CMA,  February 09, 2009 2:53 PM  Follow-up for Phone Call        may have appointment with dr Lovell Sheehan t o morrow at 11am Follow-up by: Willy Eddy, LPN,  February 09, 2009 3:09 PM  Additional Follow-up for Phone Call Additional follow up Details #1::        Done. Additional Follow-up by: Lynann Beaver CMA,  February 09, 2009 3:17 PM

## 2010-03-10 NOTE — Progress Notes (Signed)
Summary: [employer] called re: script for [employer] Note From [employer] back at [employer] [phone] Lynnell Catalan   Caller: [employer]   -Lynnell Catalan Summary of Call: Need to know if Dr. Lovell Sheehan did a prescription for Cymbalta for Workmans Comp? Pls call [phone] Initial call taken by: Lucy Antigua,  October 26, 2009 1:46 PM  Follow-up for Phone Call        was ordered for 30 days- unknow if for workers comp-sheronda rtold Follow-up by: Willy Eddy, LPN,  October 26, 2009 2:16 PM

## 2010-03-12 DIAGNOSIS — F341 Dysthymic disorder: Secondary | ICD-10-CM

## 2010-03-12 DIAGNOSIS — IMO0002 Reserved for concepts with insufficient information to code with codable children: Secondary | ICD-10-CM

## 2010-03-12 DIAGNOSIS — M47817 Spondylosis without myelopathy or radiculopathy, lumbosacral region: Secondary | ICD-10-CM

## 2010-03-16 ENCOUNTER — Other Ambulatory Visit: Payer: Self-pay | Admitting: Neurosurgery

## 2010-03-16 DIAGNOSIS — M5410 Radiculopathy, site unspecified: Secondary | ICD-10-CM

## 2010-03-16 DIAGNOSIS — M545 Low back pain: Secondary | ICD-10-CM

## 2010-03-17 ENCOUNTER — Ambulatory Visit
Admission: RE | Admit: 2010-03-17 | Discharge: 2010-03-17 | Disposition: A | Discharge status: Home / Self Care | Payer: Worker's Compensation | Source: Ambulatory Visit | Attending: Neurosurgery | Admitting: Neurosurgery

## 2010-03-17 DIAGNOSIS — M5410 Radiculopathy, site unspecified: Secondary | ICD-10-CM

## 2010-03-17 DIAGNOSIS — M545 Low back pain: Secondary | ICD-10-CM

## 2010-03-17 MED ORDER — GADOBENATE DIMEGLUMINE 529 MG/ML IV SOLN
20.0000 mL | Freq: Once | INTRAVENOUS | Status: AC | PRN
  Administered 2010-03-17: 20 mL via INTRAVENOUS

## 2010-03-22 NOTE — H&P (Signed)
[name], [name]              ACCOUNT NO.:  [id number]  MEDICAL RECORD NO.:  [id number]           PATIENT TYPE:  I  LOCATION:  4007                         FACILITY:  [facility]  PHYSICIAN:  Ranelle Oyster, M.D.DATE OF BIRTH:  02/07/1971  DATE OF ADMISSION:  03/09/2010 DATE OF DISCHARGE:                             HISTORY & PHYSICAL   CHIEF COMPLAINTS:  Low back pain and leg pain.  PRIMARY CARE PHYSICIAN:  Stacie Glaze, MD  SURGEON:  Reinaldo Meeker, MD  HISTORY OF PRESENT ILLNESS:  A 40 year old white male with low back pain history with fusion and PEEK interbody spacer placed in March of last year.  Admitted on March 03, 2010, with increased low back pain radiating to the left leg.  X-ray showed spondylosis and radiculopathy to L5-S1.  Underwent L5-S1 fusion, pedicle screw fixation on March 03, 2010 by Dr. Gerlene Fee.  Brace was placed when out of bed.  Hemovac was stopped on March 04, 2010.  He has had problems with postoperative pain and continue left leg weakness.  I saw the patient for rehab evaluation yesterday felt he could benefit from an inpatient stay.  REVIEW OF SYSTEMS:  Notable for weakness, low-back pain.  He had some mild constipation.  Full 12-point review is in the written H and P.  PAST MEDICAL HISTORY:  Positive for: 1. Obstructive sleep apnea, on CPAP. 2. L4-L5 fusion in March 2011. 3. Cholecystectomy.  Denies alcohol or tobacco.  FAMILY HISTORY:  Noncontributory.  SOCIAL HISTORY:  The patient is married.  He is on [employer].  Wife works days and mother can assist at home.  He has one-level house with two to three steps to enter.  ALLERGIES:  None.  HOME MEDICATIONS:  Elavil, Cymbalta, hydrocodone, Valium, Klonopin, Imitrex.  LABORATORY DATA:  Hemoglobin 15.9, white count 6, platelets 207.  Sodium 141, potassium 4.4, BUN 16, creatinine 1.3.  PHYSICAL EXAMINATION:  VITAL SIGNS:  Blood pressure 120/76, pulse 64, respiratory 18,  temperature 97.5. GENERAL:  The patient is pleasant, alert and oriented x3. HEENT:  Pupils are equally round and reactive to light.  Ear, nose and throat exams are unremarkable. NECK:  Supple without JVD and lymphadenopathy. CHEST:  Clear to auscultation bilaterally without wheezes, rales or rhonchi. HEART:  Regular rate and rhythm without murmur, rubs, or gallop. ABDOMEN:  Soft, nontender. SKIN:  Notable for back incision, which was clean and intact with staples and Mepilex. NEUROLOGIC:  Cranial nerves II through XII are normal.  Reflexes are 1+ in the lower extremities.  Sensation seemed to be diminished throughout the left leg in a diffuse fashion.  Judgment, orientation, memory and mood are all appropriate.  Strength was 5/5 in the upper extremities. Right lower extremity is 4/5 hip flexion/extension, 4/5 to 4+ at the knee and 4+ at the ankle.  Left lower extremity is 1-2/5 at the hip, knee and ankle today with pain likely being a factor with movement of this extremity.  POST ADMISSION PHYSICIAN EVALUATION: 1. Functional deficit secondary to lumbar spondylosis and     radiculopathy.  The patient with persistent left leg weakness and  sensory deficits as well as pain. 2. The patient was admitted to receive collaborative interdisciplinary     care between the physiatrist, rehab nursing staff and therapy team. 3. The patient's level of medical complexity and substantial therapy     needs in context of that medical necessity cannot be provided at a     lesser intensity of care. 4. The patient has experienced substantial functional loss from his     baseline.  Upon functional evaluation today, the patient with mod     assist bed mobility, min assist transfers, min assist gait 90 feet,     min assist lower body ADLs.  Judging by the patient's diagnosis,     physical exam and functional history, he has the potential for     functional progress, which will result in measurable gains  while in     inpatient rehab.  These gains will be of substantial and practical     use upon discharge to home in facilitating mobility and self-care.     Interim changes in medical and functional status since our     preadmission screening are detailed above. 5. The physiatrist will provide 24-hour management of the medical     needs as well as oversight of the therapy plan/treatment and     provide guidance as appropriate regarding interaction of the two.     Medical problem list and plan are below. 6. A 24-hour rehab nursing team will assist in the management of the     patient's skin care needs as well as bowel and bladder function,     safety awareness, integration of therapy, concepts, techniques,     etc. 7. PT will assess and treat for lower extremity strength, range of     motion, functional mobility, adaptive techniques, equipment.  The     patient may need some bracing for the left lower extremity and is     being on motor and sensory return.  Goals are modified independent. 8. OT will assess and treat for upper extremity use, ADLs, adaptive     techniques, equipment, back precautions with goals modified    independent to occasional min assist. 9. Case management and social worker will assess and treat for     psychosocial issues and discharge planning. 10.Team conference would be held weekly to assess progress towards     goals and to determine barriers to discharge. 11.The patient has demonstrated sufficient medical stability and     exercise capacity to tolerate at least 3 hours of therapy per day     at least 5 days per week. 12.Estimated length of stay is 7-10 days.  Prognosis is good.  MEDICAL PROBLEM LIST AND PLAN: 1. Deep venous thrombosis prophylaxis, thigh-high TED hose. 2. Pain managed with oxycodone IR, Robaxin, Elavil, Cymbalta, and     Valium.  My feeling is that he will need     long-acting pain medication for pain relief.  We will have to judge     going  forward. 3. Mood:  Provide ego support, Klonopin p.r.n. for anxiety. 4. Wound:  Continue Mepilex and close observation.  Wound looks     appropriate today.     Ranelle Oyster, M.D.     ZTS/MEDQ  D:  03/09/2010  T:  03/10/2010  Job:  [id number]  cc:   Stacie Glaze, MD Reinaldo Meeker, M.D.  Electronically Signed by Faith Rogue M.D. on 03/22/2010 04:34:14 PM

## 2010-03-22 NOTE — Discharge Summary (Signed)
[name], [name]              ACCOUNT NO.:  [id number]  MEDICAL RECORD NO.:  [id number]           PATIENT TYPE:  I  LOCATION:  4007                         FACILITY:  [facility]  PHYSICIAN:  Ranelle Oyster, M.D.DATE OF BIRTH:  Oct 31, 1970  DATE OF ADMISSION:  03/09/2010 DATE OF DISCHARGE:                              DISCHARGE SUMMARY   DISCHARGE DATE:  March 12, 2010  DISCHARGE DIAGNOSES: 1. Lumbar spondylosis with radiculopathy status post lumbar L5-S1     fusion with pedicle screw fixation. 2. Pain management. 3. History of migraine headaches. 4. Obstructive sleep apnea was continuous positive airway pressure.  A 40 year old male with history of lumbar L4-5 HNP with lumbar fusion and PEEK interbody spacer in March 2011, admitted January 26 with increased low back pain radiating to left lower extremity.  X-rays and imaging showed spondylosis with radiculopathy of lumbar L5-S1. Underwent lumbar L5-S1 fusion, pedicle screw fixation on January 26 per Dr. Gerlene Fee.  Back brace when out of bed, the postoperative pain management.  He was minimal assist for ambulation.  He was admitted for comprehensive rehab program.  PAST MEDICAL HISTORY:  See discharge diagnoses.  No alcohol.  No tobacco.  ALLERGIES:  None.  SOCIAL HISTORY:  Married.  He is on workers compensation.  His wife works day shifts.  Mother can assist, 1-level home, 2-3 steps to entry.  FUNCTIONAL HISTORY PRIOR TO ADMISSION:  Independent.  He does not drive.  FUNCTIONAL STATUS UPON ADMISSION TO REHAB SERVICES:  Moderate assist bed mobility, minimal assist transfers, minimal assist to ambulate 90 feet, minimal assist for lower body activities of daily living.  MEDICATIONS PRIOR TO ADMISSION: 1. Elavil 50 mg at bedtime. 2. Cymbalta 30 mg a.m., 60 mg at bedtime. 3. Hydrocodone as needed. 4. Valium 5 mg as needed. 5. Klonopin 1 mg at bedtime. 6. Imitrex as needed.  PHYSICAL EXAMINATION:  VITAL SIGNS:  Blood  pressure 120/76, pulse 64, temperature 97.5, respirations 18. GENERAL:  This is an alert male in no acute distress, oriented x3. NEUROLOGIC:  Deep tendon reflexes 2+.  Sensation decreased to light touch to left lower extremity. SKIN:  Back incision with staples intact small amount of sanguinous drainage.  No palpable masses. LUNGS:  Clear to auscultation. CARDIAC:  Regular rate and rhythm. ABDOMEN:  Soft, nontender.  Good bowel sounds.  REHABILITATION HOSPITAL COURSE:  The patient was admitted to inpatient rehab services with therapies initiated on a 3-hour daily basis consisting of physical therapy, occupational therapy and 24-hour rehabilitation nursing. The following issues were addressed during the patient's rehabilitation stay.  Pertaining to [name]'s lumbar spondylosis, radiculopathy, he had undergone fusion with pedicle screw fixation of lumbar L5-S1 per Dr. Aliene Beams on January 26, staples intact.  He had a small amount of serous drainage, cleansed with sterile water and applied dry dressing.  No signs of infection.  He remained afebrile.  He was using a back brace when out of bed.  Pain management ongoing with the patient's Dilaudid that he was using prior to hospital admission as well as his Elavil and Cymbalta.  He had no bowel or bladder disturbances.  He remained on CPAP for history of obstructive sleep apnea.  It was noted the patient had undergone a lumbar fusion of lumbar L4-5 for HNP in March 2011, which he was doing well.  The patient received weekly collaborative interdisciplinary team conferences to discuss estimated length of stay, family teaching and any barriers to discharge.  He was essentially modified independence in his room.  His strength endurance had greatly improved.  He was encouraged to overall progress.  It was discussed no driving.  Discussion was made for home health versus outpatient therapies and he would follow up with Neurosurgery, Dr.  Aliene Beams.  LATEST LABORATORIES:  Hemoglobin 13, hematocrit of 41, platelet 249,000. Sodium 139, potassium 4.1, BUN 12, creatinine 1.3.  DISCHARGE MEDICATIONS AT TIME OF DICTATION: 1. Elavil 50 mg at bedtime. 2. Klonopin 1 mg at bedtime. 3. Colace 100 mg twice daily. 4. Cymbalta 30 mg in the a.m., 60 mg at bedtime. 5. Valium 5 mg every 6 hours as needed for spasms. 6. Robaxin 500 mg every 6 hours as needed for spasms, dispense of 60     tablets. 7. Hydromorphone 4 mg every 3 hours as needed for pain.  The patient     did have his own supply.  His diet was regular.  SPECIAL INSTRUCTIONS:  Back brace when out of bed.  Follow up with Dr. Aliene Beams, Neurosurgery, 1 week for removal of staples.  Call if any increased redness, drainage, or fever.  Follow up with Dr. Faith Rogue at the outpatient rehab service office as needed. Dr. Darryll Capers, medical management.     Mariam Dollar, P.A.   ______________________________ Ranelle Oyster, M.D.    DA/MEDQ  D:  03/11/2010  T:  03/11/2010  Job:  [id number]  cc:   Stacie Glaze, MD Reinaldo Meeker, M.D.  Electronically Signed by Mariam Dollar P.A. on 03/11/2010 02:58:16 PM Electronically Signed by Faith Rogue M.D. on 03/22/2010 04:34:04 PM

## 2010-03-27 NOTE — Op Note (Signed)
NAMEWAYBURN, SHALER              ACCOUNT NO.:  [id number]  MEDICAL RECORD NO.:  [id number]          PATIENT TYPE:  INP  LOCATION:  3004                         FACILITY:  [facility]  PHYSICIAN:  Reinaldo Meeker, M.D. DATE OF BIRTH:  1970-06-12  DATE OF PROCEDURE:  03/03/2010 DATE OF DISCHARGE:                              OPERATIVE REPORT   PREOPERATIVE DIAGNOSIS:  Spondylosis, foraminal approach, L5-S1 left.  POSTOPERATIVE DIAGNOSIS:  Spondylosis, foraminal approach, L5-S1 left.  PROCEDURE:  Left L5-S1 transforaminal lumbar interbody fusion with PEEK interbody spacer followed by right L5-S1 pedicle screw fixation with [place] pedicle screw system followed by right L5-S1 posterolateral fusion.  SURGEON:  Reinaldo Meeker, MD  ASSISTANT:  Tia Alert, MD  PROCEDURE IN DETAIL:  After placing in the prone position, the patient's back was prepped and draped in the usual sterile fashion.  Localizing fluoroscopy was used prior to incision to identify the appropriate level.  Midline incision was made about the spinous process of L5 and S1.  The inferior edge of the L4 spinous process was also exposed. Using Bovie cutting current, the incision was carried at the spinous processes.  Subperiosteal dissection was then carried out along the left- sided spinous processes, lamina, and all of the facet joint, and exposure was carried on the right in a similar fashion but also to extend far laterally to the transverse process of L5 and the far lateral aspect of the sacrum on the right.  Self-retaining retractor was placed for exposure and x-rays showed approach to the appropriate level. Spinous processes of L5 and S1 were removed.  On the patient's left side, generous laminotomy was performed by removing the inferior three- quarters of the L5 lamina, the medial 90% of the facet joint, and the superior one-third of the S1 segment.  The L5 and S1 nerve roots were both tracked out their  foramen, more so than needed for transforaminal lumbar interbody fusion.  The L5 nerve root was followed out his foramen, and bony overgrowth and spurring were removed to decompress it thoroughly.  At this time, the disk space was entered and thoroughly cleaned out with pituitary rongeurs and curettes.  It was then distracted up to a 10-mm size.  This was felt to be a good choice. Variety of instruments were used to clean out the disk space.  A mixture of EquivaBone, Osteocel Plus, and autologous bone were then placed deep within the interspace to help with interbody fusion and then a cage packed with a similar material was chosen.  It was impacted in the disk space without difficulty and fluoroscopy showed it to be in excellent position.  We were able to kick it into a nice transverse position.  We then did pedicle screw fixation on the right at L5-S1.  We did a standard entry point with a drill and then used the awl and then tapped with a 5.5-mm tap and placed a 6.5- x 45-mm screw at L5 and a 6.5- x 40- mm screw at S1.  These were found to be in excellent position in AP and lateral fluoroscopy.  We then irrigated copiously with  antibiotic irrigation, placed Gelfoam over the dural exposure.  We attached a rod, did final tightening with torque and counter-torque.  Fluoroscopy showed good position of the interbody spacer as well as the screws and the rod. Bone on the right side of the lamina and facet was decorticated and the mixture of EquivaBone, Osteocel Plus, and autologous bone was placed for posterolateral fusion.  Epidural drain was left in the epidural space and brought out through a separate stab wound incision.  The wound was then closed in multiple layers of Vicryl in the muscle, fascia, subcutaneous, subcuticular tissues, and the staples were placed on the skin.  Sterile dressing was then applied, and the patient was extubated and taken to recovery room in stable  condition.          ______________________________ Reinaldo Meeker, M.D.     ROK/MEDQ  D:  03/03/2010  T:  03/04/2010  Job:  [id number]  Electronically Signed by Aliene Beams M.D. on 03/27/2010 10:48:52 PM

## 2010-04-16 NOTE — Discharge Summary (Signed)
  [name], [name]              ACCOUNT NO.:  [id number]  MEDICAL RECORD NO.:  [id number]           PATIENT TYPE:  I  LOCATION:  4007                         FACILITY:  [facility]  PHYSICIAN:  Reinaldo Meeker, M.D. DATE OF BIRTH:  09/24/1970  DATE OF ADMISSION:  03/09/2010 DATE OF DISCHARGE:  03/12/2010                              DISCHARGE SUMMARY   PRIMARY DIAGNOSIS:  L5-S1 spondylosis with foraminal encroachment.  PRIMARY OPERATIVE PROCEDURE:  Left L5-S1 TLIF with right L5-S1 pedicle screw fixation.  HISTORY:  [name] is a 40 year old gentleman who is status post L4- 5 left-sided minimally invasive TLIF.  Tolerated that without difficulty, but now has left spondylosis with foraminal encroachment at L5-S1 on the left and severe left lower extremity pain.  He is now admitted for left L5-S1 TLIF with right L5-S1 pedicle screw fixation. On March 03, 2010, the patient was taken to the operating room where he underwent the above-mentioned procedure, tolerated it well.  He did however complain of increasing leg pain over the next few days which was quite severe.  On March 07, 2010, he was complaining of grade 9/10 back and left hip pain.  He tried physical therapy and there is felt to be a lot of weakness.  On a direct muscle testing; however, with a lot of encouragement, it  was determined that he really had no true weakness but just had splinting secondary to pain.  We were able therefore to start to advance him with the physical therapy.  Comprehensive inpatient rehab was consulted.  It was felt that he was a good candidate for short inpatient stay.  On March 09, 2010, he was therefore transferred to the rehab unit.  His condition was stable versus admission.          ______________________________ Reinaldo Meeker, M.D.     ROK/MEDQ  D:  03/27/2010  T:  03/28/2010  Job:  [id number]  Electronically Signed by Aliene Beams M.D. on 04/16/2010 10:22:40 AM

## 2010-04-28 NOTE — Discharge Summary (Signed)
  [name], [name]              ACCOUNT NO.:  [id number]  MEDICAL RECORD NO.:  [id number]           PATIENT TYPE:  I  LOCATION:  3004                         FACILITY:  [facility]  PHYSICIAN:  Reinaldo Meeker, M.D. DATE OF BIRTH:  May 04, 1970  DATE OF ADMISSION:  03/03/2010 DATE OF DISCHARGE:  03/09/2010                              DISCHARGE SUMMARY   PRIMARY DIAGNOSIS:  Spondylosis, foraminal encroachment L5-S1 left.  PRIMARY OPERATIVE PROCEDURE:  Left L5-S1 TLIF with PEEK interbody spacer with right L5-S1 pedicle screw fixation.  HISTORY:  [name] is a 40 year old gentleman who had an L4-5 minimally invasive TLIF.  Now, he has marked spondylosis and foraminal encroachment L5-S1 on the left.  He comes in for left L5-S1 TLIF.  He underwent the procedure on March 03, 2010.  He tolerated the procedure well.  Postop, he still had a fair amount of pain and was very slow to increase his activity.  His muscle testing, however, was 5/5 in all muscles tested even though he said his legs felt weak.  Slowly, he was able to increase his activity with physical therapy.  On March 09, 2010, it was elected that he could go to rehab for comprehensive inpatient rehab.  His condition was stable versus admission.  His wound is healing well.  He was discharged on pain medicine.          ______________________________ Reinaldo Meeker, M.D.     ROK/MEDQ  D:  04/16/2010  T:  04/16/2010  Job:  [id number]  Electronically Signed by Aliene Beams M.D. on 04/28/2010 06:15:28 PM

## 2010-05-02 LAB — CBC
HCT: 45.3 % (ref 39.0–52.0)
Hemoglobin: 15.5 g/dL (ref 13.0–17.0)
MCV: 89.3 fL (ref 78.0–100.0)
RDW: 14.1 % (ref 11.5–15.5)
WBC: 6.2 10*3/uL (ref 4.0–10.5)

## 2010-05-02 LAB — BASIC METABOLIC PANEL
Chloride: 106 mEq/L (ref 96–112)
GFR calc non Af Amer: >60 mL/min (ref >60)
Glucose, Bld: 96 mg/dL (ref 70–99)
Potassium: 4.2 mEq/L (ref 3.5–5.1)
Sodium: 138 mEq/L (ref 135–145)

## 2010-05-02 LAB — TYPE AND SCREEN
ABO/RH(D): A POS
Antibody Screen: NEGATIVE

## 2010-05-02 LAB — ABO/RH: ABO/RH(D): A POS

## 2010-05-03 ENCOUNTER — Encounter: Payer: Self-pay | Admitting: *Deleted

## 2010-05-04 ENCOUNTER — Ambulatory Visit (INDEPENDENT_AMBULATORY_CARE_PROVIDER_SITE_OTHER): Payer: Worker's Compensation | Admitting: Internal Medicine

## 2010-05-04 ENCOUNTER — Encounter: Payer: Self-pay | Admitting: Internal Medicine

## 2010-05-04 VITALS — BP 140/90 | HR 76 | Temp 98.2°F | Resp 16 | Ht 72.0 in | Wt 300.0 lb

## 2010-05-04 DIAGNOSIS — R519 Headache, unspecified: Secondary | ICD-10-CM

## 2010-05-04 DIAGNOSIS — F329 Major depressive disorder, single episode, unspecified: Secondary | ICD-10-CM

## 2010-05-04 DIAGNOSIS — T84498A Other mechanical complication of other internal orthopedic devices, implants and grafts, initial encounter: Secondary | ICD-10-CM

## 2010-05-04 DIAGNOSIS — F32A Depression, unspecified: Secondary | ICD-10-CM

## 2010-05-04 DIAGNOSIS — R51 Headache: Secondary | ICD-10-CM

## 2010-05-04 DIAGNOSIS — IMO0001 Reserved for inherently not codable concepts without codable children: Secondary | ICD-10-CM

## 2010-05-04 NOTE — Progress Notes (Signed)
Subjective:    Patient ID: [name], male    DOB: 19-Oct-1970, 40 y.o.   MRN: [id number]  HPI [name] is a 40 year old white male with a complicated history of multiple attempts at controlling his back pain 3 spinal fusion the initial fusion at one level was unsuccessful and a spinal leak a revision involving multiple levels has not resulted in any pain control the significant nature of the patient remains disabled and in pain.   Review of Systems  Constitutional: Negative for fever and fatigue.  HENT: Negative for hearing loss, congestion, neck pain and postnasal drip.   Eyes: Negative for discharge, redness and visual disturbance.  Respiratory: Negative for cough, shortness of breath and wheezing.   Cardiovascular: Negative for leg swelling.  Gastrointestinal: Negative for abdominal pain, constipation and abdominal distention.  Genitourinary: Negative for urgency and frequency.  Musculoskeletal: Positive for back pain and gait problem. Negative for joint swelling and arthralgias.  Skin: Negative for color change and rash.  Neurological: Positive for dizziness, weakness and headaches. Negative for light-headedness.  Hematological: Negative for adenopathy.  Psychiatric/Behavioral: Negative for behavioral problems.   Past Medical History  Diagnosis Date  . Allergy   . Asthma   . Sinusitis, chronic   . Apnea   . Restless leg   . Spinal fusion failure     spinal fluid leak   Past Surgical History  Procedure Date  . Cholecystectomy   . Spinal fusion     l4-5 then l3-5 revision with failure  . Lumbar fusion L45 the L-S1 revision    spinal leak complication    reports that he has never smoked. He does not have any smokeless tobacco history on file. He reports that he does not drink alcohol or use illicit drugs. family history includes Coronary artery disease in an unspecified family member; Heart disease in his father; Hyperlipidemia in his father; and Hypertension in  his mother. Not on File      Objective:   Physical Exam    Blood pressure 140/90, pulse 76, temperature 98.2 F (36.8 C), temperature source Oral, resp. rate 16, height 6' (1.829 m), weight 300 lb (136.079 kg).  Patient has a 40 year old white male vital signs noted morbidly obese following back surgery in moderate distress HEENT shows pupils equal round reactive to light and accommodation neck is supple lung fields clear heart examination is regular rhythm abdomen soft he is wearing an abdominal brace for his back he walks with a cane and is in apparent pain   Assessment & Plan:  His is accompanied case with multiple comorbid findings.  Having had the ability to know this patient before these events I would concur with the neurologist that the depression the migraine headache pattern are intricately related to the failed back surgeries and spinal leaks. The patient has no comorbid history of depression prior to this surgery nor did he have a history of migraines prior to this surgery in review of his records he had restless leg syndrome sleep apnea chronic sinusitis obesity and we helped in the diagnosis of his disc disease which resulted in a surgery.  I am concerned about the degree of depression that he has as well as the recurrence of these migraine headaches and would like to try a medication that might help both of these he cannot afford the Cymbalta which might be an ideal medication for back pain and depression therefore we will give him samples of Cymbalta and would urge Workmen's Compensation  to see this as part of his treatment for the injury. He may benefit from Neurontin but I am concerned about using Neurontin in the setting of his memory disorder therefore we will give him samples of Lyrica starting him out on 50 and bedtime and increasing him to 50 twice a day then to 75 twice a day and we will follow him back in one month to see if we can titrate this further for pain control as  well as it may help him with the headaches.  I suspect the memory loss is multifactorial I think the chronic pain place the largest role but certainly the depression over his loss of functioning to demonstrate a significant effect on concentration and memory I would recommend that Workmen's Compensation allowed neuropsych testing so that we can see if there is indeed permanent memory impact or if this is related to the depression the medications.

## 2010-05-05 ENCOUNTER — Telehealth: Payer: Self-pay | Admitting: *Deleted

## 2010-05-05 NOTE — Telephone Encounter (Signed)
This is address of pt's workers comp claim----Janet scanlon [phone] nurse---------------------------------worker comp--Karen Sherrie Mustache -phone [phone] ext [phone]--------------------------[phone]-fax------------------------------------------------------------------------------------------the address is  [address]                                                                                                                                                                [address] [address]                                                                                                                                                    [place] [address]

## 2010-05-06 ENCOUNTER — Encounter: Payer: Self-pay | Admitting: Internal Medicine

## 2010-05-09 ENCOUNTER — Other Ambulatory Visit: Payer: Self-pay

## 2010-05-16 ENCOUNTER — Encounter: Payer: Self-pay | Admitting: Internal Medicine

## 2010-05-16 ENCOUNTER — Other Ambulatory Visit (INDEPENDENT_AMBULATORY_CARE_PROVIDER_SITE_OTHER): Payer: BC Managed Care – PPO

## 2010-05-16 DIAGNOSIS — Z0000 Encounter for general adult medical examination without abnormal findings: Secondary | ICD-10-CM

## 2010-05-16 LAB — BASIC METABOLIC PANEL
Calcium: 8.9 mg/dL (ref 8.4–10.5)
GFR: 73.87 mL/min (ref >60.00)
Potassium: 4.9 mEq/L (ref 3.5–5.1)
Sodium: 139 mEq/L (ref 135–145)

## 2010-05-16 LAB — POCT URINALYSIS DIPSTICK
Ketones, UA: NEGATIVE
Leukocytes, UA: NEGATIVE
Protein, UA: NEGATIVE

## 2010-05-16 LAB — CBC WITH DIFFERENTIAL/PLATELET
Basophils Absolute: 0 10*3/uL (ref 0.0–0.1)
Basophils Relative: 0.3 % (ref 0.0–3.0)
Eosinophils Relative: 3.7 % (ref 0.0–5.0)
Hemoglobin: 15.1 g/dL (ref 13.0–17.0)
Lymphocytes Relative: 24.9 % (ref 12.0–46.0)
Monocytes Relative: 9.6 % (ref 3.0–12.0)
Neutro Abs: 4.9 10*3/uL (ref 1.4–7.7)
RBC: 4.89 Mil/uL (ref 4.22–5.81)
WBC: 8 10*3/uL (ref 4.5–10.5)

## 2010-05-16 LAB — LIPID PANEL: VLDL: 7.4 mg/dL (ref 0.0–40.0)

## 2010-05-16 LAB — HEPATIC FUNCTION PANEL
ALT: 41 U/L (ref 0–53)
AST: 29 U/L (ref 0–37)
Albumin: 3.5 g/dL (ref 3.5–5.2)
Alkaline Phosphatase: 80 U/L (ref 39–117)
Total Protein: 6.2 g/dL (ref 6.0–8.3)

## 2010-05-16 LAB — TSH: TSH: 0.76 u[IU]/mL (ref 0.35–5.50)

## 2010-05-24 ENCOUNTER — Encounter: Payer: Self-pay | Admitting: Internal Medicine

## 2010-05-31 ENCOUNTER — Other Ambulatory Visit: Payer: Self-pay | Admitting: *Deleted

## 2010-05-31 MED ORDER — CLONAZEPAM 1 MG PO TABS: 1.0000 mg | ORAL_TABLET | Freq: Every evening | ORAL | Status: DC | PRN

## 2010-06-06 ENCOUNTER — Other Ambulatory Visit: Payer: Self-pay | Admitting: Neurosurgery

## 2010-06-06 ENCOUNTER — Telehealth: Payer: Self-pay | Admitting: *Deleted

## 2010-06-06 DIAGNOSIS — M545 Low back pain: Secondary | ICD-10-CM

## 2010-06-06 MED ORDER — OFLOXACIN 0.3 % OT SOLN: 5.0000 [drp] | Freq: Every day | OTIC | Status: AC

## 2010-06-06 NOTE — Telephone Encounter (Signed)
Per dr Lovell Sheehan- may have floxin ear drops 5 drops in affected ear bid for 7 days

## 2010-06-06 NOTE — Telephone Encounter (Signed)
Pt. Believes he has swimmers ear.......has pain, decreased hearing, and ear feels like it is stopped. Asking [facility]

## 2010-06-15 ENCOUNTER — Ambulatory Visit
Admission: RE | Admit: 2010-06-15 | Discharge: 2010-06-15 | Disposition: A | Discharge status: Home / Self Care | Payer: Self-pay | Source: Ambulatory Visit | Attending: Neurosurgery | Admitting: Neurosurgery

## 2010-06-15 DIAGNOSIS — M545 Low back pain: Secondary | ICD-10-CM

## 2010-06-17 ENCOUNTER — Ambulatory Visit
Admission: RE | Admit: 2010-06-17 | Discharge: 2010-06-17 | Disposition: A | Discharge status: Home / Self Care | Payer: Self-pay | Source: Ambulatory Visit | Attending: Neurosurgery | Admitting: Neurosurgery

## 2010-06-17 ENCOUNTER — Other Ambulatory Visit: Payer: Self-pay | Admitting: Neurosurgery

## 2010-06-17 DIAGNOSIS — G971 Other reaction to spinal and lumbar puncture: Secondary | ICD-10-CM

## 2010-06-20 ENCOUNTER — Encounter: Payer: Self-pay | Admitting: Internal Medicine

## 2010-06-20 ENCOUNTER — Ambulatory Visit (INDEPENDENT_AMBULATORY_CARE_PROVIDER_SITE_OTHER): Payer: BC Managed Care – PPO | Admitting: Internal Medicine

## 2010-06-20 VITALS — BP 130/80 | HR 60 | Temp 98.2°F | Resp 16 | Ht 72.0 in | Wt 288.0 lb

## 2010-06-20 DIAGNOSIS — F329 Major depressive disorder, single episode, unspecified: Secondary | ICD-10-CM

## 2010-06-20 DIAGNOSIS — M5106 Intervertebral disc disorders with myelopathy, lumbar region: Secondary | ICD-10-CM

## 2010-06-20 DIAGNOSIS — Z0000 Encounter for general adult medical examination without abnormal findings: Secondary | ICD-10-CM

## 2010-06-20 DIAGNOSIS — G4730 Sleep apnea, unspecified: Secondary | ICD-10-CM

## 2010-06-20 MED ORDER — DULOXETINE HCL 30 MG PO CPEP: 90.0000 mg | ORAL_CAPSULE | Freq: Every day | ORAL | Status: DC

## 2010-06-20 NOTE — Progress Notes (Signed)
  Subjective:    Patient ID: [name], male    DOB: 07/16/1970, 40 y.o.   MRN: [id number]  HPI Patient has persistent back pain.  His CT myelogram last week and has an appointment with the neurosurgeon for followup on this he did have a spinal leak after the CT myelogram the required blood patch.  Due to these setbacks his depression has worsened the Cymbalta takes the edge off with depression but certainly does not give him the quality of life that he wishes at this point.  He is also here for his yearly physical examination.    Review of Systems  Constitutional: Positive for activity change, appetite change and fatigue.  HENT: Positive for rhinorrhea, neck pain, postnasal drip and tinnitus.   Eyes: Negative.   Respiratory: Positive for wheezing.   Cardiovascular: Negative.   Genitourinary: Negative.   Neurological: Positive for weakness and numbness.  Psychiatric/Behavioral: Positive for dysphoric mood and decreased concentration.   Past Medical History  Diagnosis Date  . Allergy   . Asthma   . Sinusitis, chronic   . Apnea   . Restless leg   . Spinal fusion failure     spinal fluid leak   Past Surgical History  Procedure Date  . Cholecystectomy   . Spinal fusion     l4-5 then l3-5 revision with failure  . Lumbar fusion L45 the L-S1 revision    spinal leak complication    reports that he has never smoked. He does not have any smokeless tobacco history on file. He reports that he does not drink alcohol or use illicit drugs. family history includes Coronary artery disease in an unspecified family member; Heart disease in his father; Hyperlipidemia in his father; and Hypertension in his mother. No Known Allergies     Objective:   Physical Exam BP 130/80  Pulse 60  Temp(Src) 98.2 F (36.8 C) (Oral)  Resp 16  Ht 6' (1.829 m)  Wt 288 lb (130.636 kg)  BMI 39.06 kg/m2    moderately obese white male in apparent pain.  Toe signs stable he is afebrile neck is  supple lung fields were clear heart examination regular rhythm abdomen is soft protuberant loud bowel sounds present all 4 quadrants extremity examination revealed no edema neuromuscularly he had equal grips in the upper extremity but wide gait lower extremity was walking with pain posteriorly there is a surgical scar mid lower back prostate was normal in size and consistency stool the vault is heme negative neurological examination revealed a depressed individual but despite appropriate judgment.      Assessment & Plan:  . Patient presents for yearly preventative medicine examination.   all immunizations and health maintenance protocols were reviewed with the patient and they are up to date with these protocols.   screening laboratory values were reviewed with the patient including screening of hyperlipidemia renal function and hepatic function.   There medications past medical history social history problem list and allergies were reviewed in detail.   Goals were established with regard to weight loss exercise diet in compliance with   Patient's back pain is severe it has caused reactive depression the Cymbalta is no longer effective in controlling this we'll increase the Cymbalta to 90 mg and followup in 4 weeks.  Had a myelogram which the results are still pending he has an appointment with his neurologist for optimal pain control will be for pain control to the neurologist

## 2010-07-14 ENCOUNTER — Encounter: Payer: Self-pay | Admitting: Internal Medicine

## 2010-07-15 ENCOUNTER — Telehealth: Payer: Self-pay | Admitting: *Deleted

## 2010-07-15 NOTE — Telephone Encounter (Signed)
PC from pt C/O severe constipation, he reports drinking tons of water, eating a high fiber diet with fruits & vegies, etc.  Cymbalta increase dose in May has helped his depression; could it also be causing constipation?   Pt also takes a lot of pain med and I did tell him more that likely is is the pain meds.  Pt wanted Dr Lovell Sheehan to be informed.

## 2010-07-15 NOTE — Telephone Encounter (Signed)
Pt informed and he voiced his understanding. 

## 2010-07-15 NOTE — Telephone Encounter (Signed)
1 

## 2010-07-15 NOTE — Telephone Encounter (Signed)
Per dr Lovell Sheehan- may try mirilax otc

## 2010-07-26 ENCOUNTER — Ambulatory Visit: Payer: Self-pay | Admitting: Internal Medicine

## 2010-08-22 ENCOUNTER — Ambulatory Visit: Payer: Self-pay | Admitting: Internal Medicine

## 2010-09-02 ENCOUNTER — Ambulatory Visit: Payer: Self-pay | Admitting: Internal Medicine

## 2010-09-07 ENCOUNTER — Ambulatory Visit (INDEPENDENT_AMBULATORY_CARE_PROVIDER_SITE_OTHER): Payer: BC Managed Care – PPO | Admitting: Internal Medicine

## 2010-09-07 ENCOUNTER — Encounter: Payer: Self-pay | Admitting: Internal Medicine

## 2010-09-07 DIAGNOSIS — E669 Obesity, unspecified: Secondary | ICD-10-CM

## 2010-09-07 DIAGNOSIS — G4733 Obstructive sleep apnea (adult) (pediatric): Secondary | ICD-10-CM

## 2010-09-07 DIAGNOSIS — M5106 Intervertebral disc disorders with myelopathy, lumbar region: Secondary | ICD-10-CM

## 2010-09-07 DIAGNOSIS — T84498A Other mechanical complication of other internal orthopedic devices, implants and grafts, initial encounter: Secondary | ICD-10-CM

## 2010-09-07 MED ORDER — VILAZODONE HCL 40 MG PO TABS: 1.0000 | ORAL_TABLET | Freq: Two times a day (BID) | ORAL | Status: DC

## 2010-09-07 NOTE — Progress Notes (Signed)
  Subjective:    Patient ID: [name], male    DOB: 1970-12-14, 40 y.o.   MRN: [id number]  HPI  This patient had previous surgical intervention in his C-spine recently he developed increasing numbness in both arms with weakness he has seen his neurosurgeon locally who is referred him to [facility] for further evaluation due to the sudden onset of the worsening weakness an MRI has been ordered.  He has continued problem with weight gain due to pain and lack of exercise his comorbid findings of sleep apnea and restless leg syndrome as well as asthma    Review of Systems  Constitutional: Negative for fever and fatigue.  HENT: Negative for hearing loss, congestion, neck pain and postnasal drip.   Eyes: Negative for discharge, redness and visual disturbance.  Respiratory: Negative for cough, shortness of breath and wheezing.   Cardiovascular: Negative for leg swelling.  Gastrointestinal: Negative for abdominal pain, constipation and abdominal distention.  Genitourinary: Negative for urgency and frequency.  Musculoskeletal: Negative for joint swelling and arthralgias.  Skin: Negative for color change and rash.  Neurological: Positive for weakness and light-headedness.  Hematological: Negative for adenopathy.  Psychiatric/Behavioral: Positive for confusion, dysphoric mood, decreased concentration and agitation. Negative for behavioral problems. The patient is nervous/anxious.    Past Medical History  Diagnosis Date  . Allergy   . Asthma   . Sinusitis, chronic   . Apnea   . Restless leg   . Spinal fusion failure     spinal fluid leak   Past Surgical History  Procedure Date  . Cholecystectomy   . Spinal fusion     l4-5 then l3-5 revision with failure  . Lumbar fusion L45 the L-S1 revision    spinal leak complication    reports that he has never smoked. He does not have any smokeless tobacco history on file. He reports that he does not drink alcohol or use illicit drugs. family  history includes Coronary artery disease in an unspecified family member; Heart disease in his father; Hyperlipidemia in his father; and Hypertension in his mother. No Known Allergies      Objective:   Physical Exam  Nursing note and vitals reviewed. Constitutional: He appears well-developed and well-nourished.       Obese  HENT:  Head: Normocephalic and atraumatic.  Eyes: Conjunctivae are normal. Pupils are equal, round, and reactive to light.  Neck: Normal range of motion. Neck supple.  Cardiovascular: Normal rate and regular rhythm.   Pulmonary/Chest: Effort normal and breath sounds normal.  Abdominal: Soft. Bowel sounds are normal.  Psychiatric:       Markedly decreased attention span and increased anxiety and depression          Assessment & Plan:  This patient has a complex management issue but the pain due to his degenerative disc disease and his sleep apnea are sometimes at conflict.  He also severe restless leg syndrome may be due to his degenerative disc disease or maybe an independent problem.  His control of his pain is complicated by asthma allergic rhinitis which are worsened also by his morbid obesity which has been difficult to control due to to his pain and decreased activity.  Physical therapy is warranted we have suggested he contact his orthopedist to set up physical therapy would be appropriate given his back disease also continued wearing the CPAP at night was encouraged.  Medication adjustment was reviewed

## 2010-10-12 ENCOUNTER — Emergency Department (HOSPITAL_COMMUNITY)
Admission: EM | Admit: 2010-10-12 | Discharge: 2010-10-12 | Disposition: A | Discharge status: Home / Self Care | Payer: BC Managed Care – PPO | Attending: Emergency Medicine | Admitting: Emergency Medicine

## 2010-10-12 ENCOUNTER — Emergency Department (HOSPITAL_COMMUNITY): Payer: BC Managed Care – PPO

## 2010-10-12 ENCOUNTER — Telehealth: Payer: Self-pay | Admitting: *Deleted

## 2010-10-12 DIAGNOSIS — R1031 Right lower quadrant pain: Secondary | ICD-10-CM | POA: Insufficient documentation

## 2010-10-12 DIAGNOSIS — N200 Calculus of kidney: Secondary | ICD-10-CM | POA: Insufficient documentation

## 2010-10-12 DIAGNOSIS — N201 Calculus of ureter: Secondary | ICD-10-CM | POA: Insufficient documentation

## 2010-10-12 DIAGNOSIS — R110 Nausea: Secondary | ICD-10-CM | POA: Insufficient documentation

## 2010-10-12 DIAGNOSIS — Z79899 Other long term (current) drug therapy: Secondary | ICD-10-CM | POA: Insufficient documentation

## 2010-10-12 DIAGNOSIS — N1330 Unspecified hydronephrosis: Secondary | ICD-10-CM | POA: Insufficient documentation

## 2010-10-12 LAB — DIFFERENTIAL
Eosinophils Absolute: 0 10*3/uL (ref 0.0–0.7)
Lymphocytes Relative: 7 % — ABNORMAL LOW (ref 12–46)
Lymphs Abs: 1.1 10*3/uL (ref 0.7–4.0)
Monocytes Relative: 9 % (ref 3–12)
Neutrophils Relative %: 84 % — ABNORMAL HIGH (ref 43–77)

## 2010-10-12 LAB — BASIC METABOLIC PANEL
CO2: 28 mEq/L (ref 19–32)
Calcium: 9.4 mg/dL (ref 8.4–10.5)
Creatinine, Ser: 1.48 mg/dL — ABNORMAL HIGH (ref 0.50–1.35)
GFR calc non Af Amer: 53 mL/min — ABNORMAL LOW (ref >60)
Sodium: 141 mEq/L (ref 135–145)

## 2010-10-12 LAB — CBC
HCT: 46 % (ref 39.0–52.0)
MCH: 31.1 pg (ref 26.0–34.0)
MCV: 86.8 fL (ref 78.0–100.0)
RBC: 5.3 MIL/uL (ref 4.22–5.81)
WBC: 15.4 10*3/uL — ABNORMAL HIGH (ref 4.0–10.5)

## 2010-10-12 LAB — URINE MICROSCOPIC-ADD ON

## 2010-10-12 LAB — URINALYSIS, ROUTINE W REFLEX MICROSCOPIC
Glucose, UA: NEGATIVE mg/dL
Ketones, ur: NEGATIVE mg/dL
Specific Gravity, Urine: 1.022 (ref 1.005–1.030)
pH: 8.5 — ABNORMAL HIGH (ref 5.0–8.0)

## 2010-10-12 MED ORDER — IOHEXOL 300 MG/ML  SOLN: 80.0000 mL | Freq: Once | INTRAMUSCULAR | Status: DC | PRN

## 2010-10-12 NOTE — Telephone Encounter (Signed)
Pt calls complaining of SEVERE abdomen pain from waist down for 4 hours.  Sent to ER ASAP.

## 2010-11-10 ENCOUNTER — Ambulatory Visit: Payer: Self-pay | Admitting: Internal Medicine

## 2010-11-11 ENCOUNTER — Telehealth: Payer: Self-pay | Admitting: Internal Medicine

## 2010-11-11 NOTE — Telephone Encounter (Signed)
He just n o showed for an appointment last week

## 2010-11-11 NOTE — Telephone Encounter (Signed)
Per dr Lovell Sheehan- he didn't show up or call for appointment on 10-4-he needs to come in for ov

## 2010-11-11 NOTE — Telephone Encounter (Signed)
Pt called and is req a med for weight loss, as previously discussed during ov with Dr Lovell Sheehan. [facility]   Also pt is req more samples of the med that Dr Lovell Sheehan gave pt for depression.

## 2010-11-14 NOTE — Telephone Encounter (Signed)
Mailbox is full.

## 2010-11-25 ENCOUNTER — Telehealth: Payer: Self-pay | Admitting: *Deleted

## 2010-11-25 NOTE — Telephone Encounter (Signed)
Pt calls stating that his short term memory has been affected by his medical problems, and he forgot about his appt.  Needs samples of Viibryd to last until he can get worked in.  Will call Bonnye back next week, and see if he can get a work in appt. 2 starter packs of meds left up front for pt.

## 2010-12-08 ENCOUNTER — Other Ambulatory Visit: Payer: Self-pay | Admitting: *Deleted

## 2010-12-08 MED ORDER — CLONAZEPAM 1 MG PO TABS: 1.0000 mg | ORAL_TABLET | Freq: Every evening | ORAL | Status: DC | PRN

## 2011-06-12 ENCOUNTER — Ambulatory Visit: Payer: Self-pay | Admitting: Family

## 2011-06-12 ENCOUNTER — Other Ambulatory Visit: Payer: Self-pay | Admitting: *Deleted

## 2011-06-12 MED ORDER — CLONAZEPAM 1 MG PO TABS: 1.0000 mg | ORAL_TABLET | Freq: Every evening | ORAL | Status: DC | PRN

## 2011-07-14 IMAGING — RF DG LUMBAR SPINE 2-3V
1 series · 2 of 2 positions shown · non-contrast
Comparison: CT myelogram 08/05/2009

CLINICAL DATA: Status post open L5-S1 TLIF

LUMBAR SPINE - 2-3 VIEW

[Series 1: run · 2 of 2 slices shown]
[im 1/2]
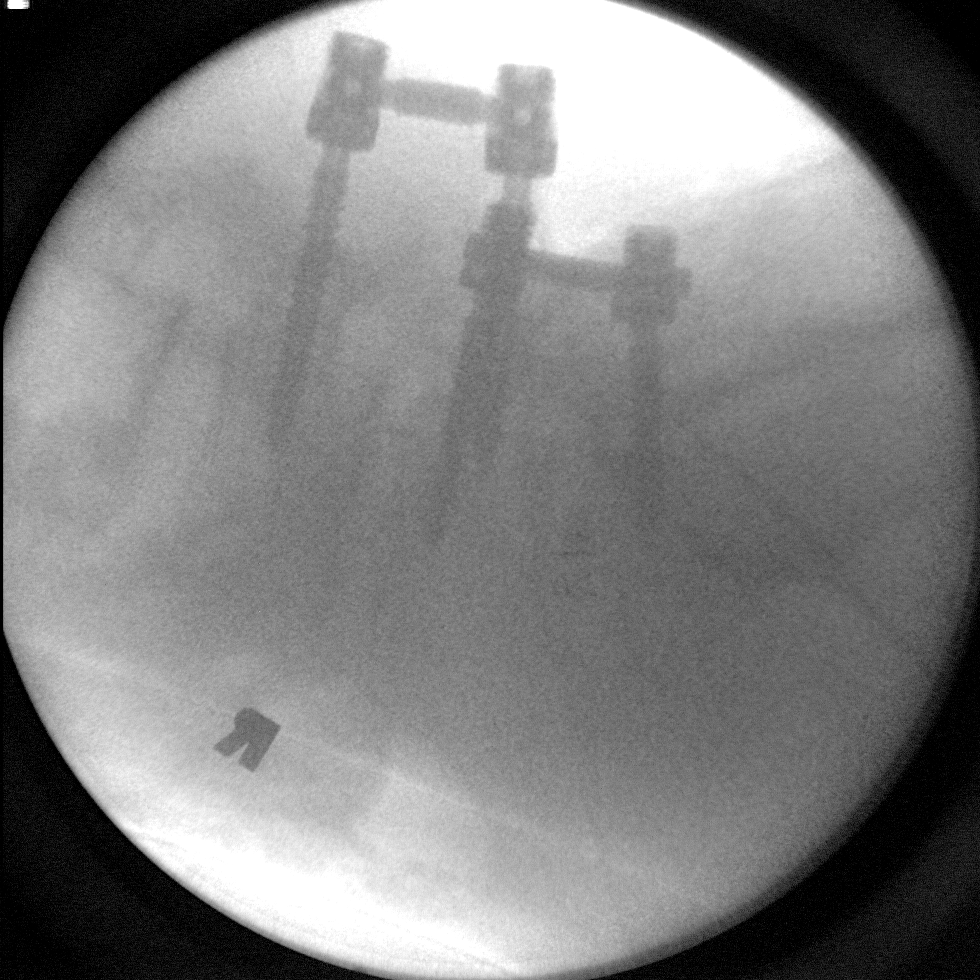
[im 2/2]
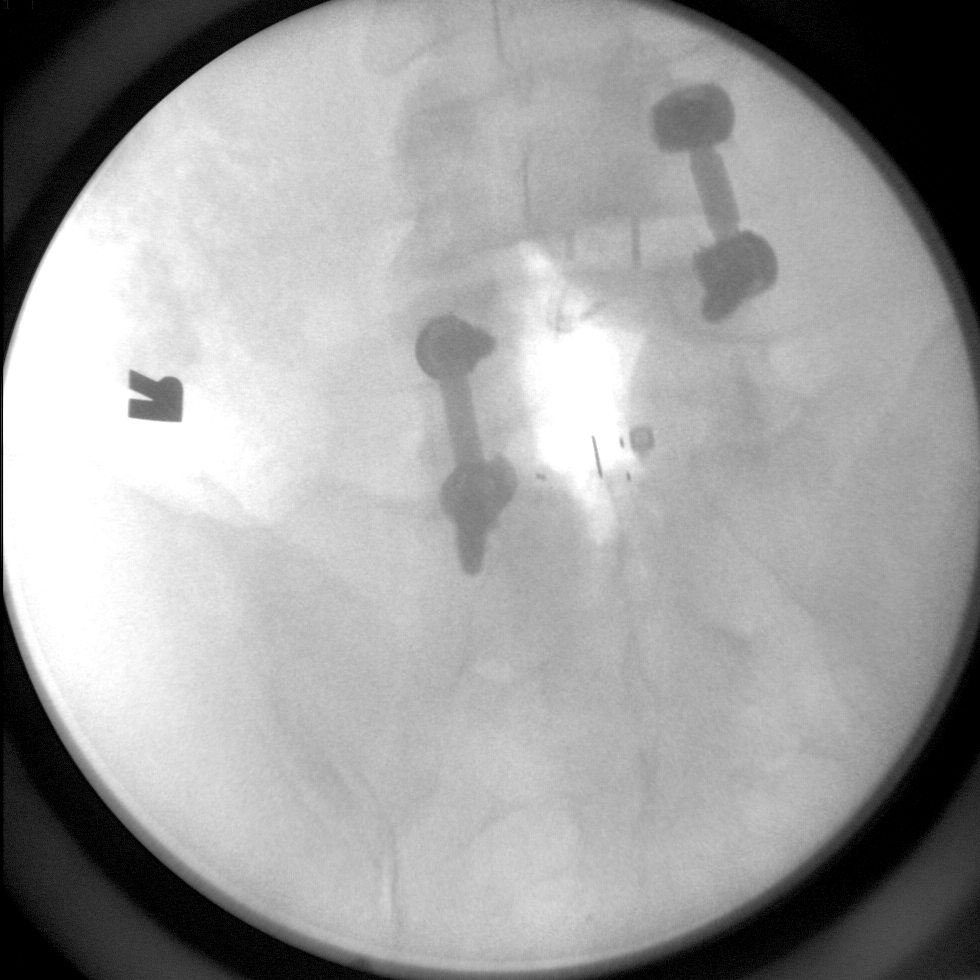

[2 of 2 positions shown; findings below may reference images not displayed]

FINDINGS: Previous L4-5 TLIF  with unilateral pedicle screw and rod
construct on the left.  New open L5- S1  TLIF with  lateral pedicle
screw and rod construct on the right.  No obvious adverse features
IMPRESSION: As above.

## 2011-09-06 ENCOUNTER — Other Ambulatory Visit: Payer: Self-pay | Admitting: *Deleted

## 2011-09-06 MED ORDER — DIAZEPAM 10 MG PO TABS: 10.0000 mg | ORAL_TABLET | ORAL | Status: DC | PRN

## 2011-09-20 ENCOUNTER — Other Ambulatory Visit: Payer: Self-pay | Admitting: Internal Medicine

## 2011-09-29 ENCOUNTER — Other Ambulatory Visit: Payer: Self-pay | Admitting: *Deleted

## 2011-09-29 MED ORDER — DIAZEPAM 10 MG PO TABS: 10.0000 mg | ORAL_TABLET | ORAL | Status: DC | PRN

## 2011-10-03 ENCOUNTER — Other Ambulatory Visit: Payer: Self-pay | Admitting: Neurosurgery

## 2011-11-02 ENCOUNTER — Other Ambulatory Visit: Payer: Self-pay | Admitting: Internal Medicine

## 2011-11-13 ENCOUNTER — Encounter (HOSPITAL_COMMUNITY)
Admission: RE | Disposition: A | Discharge status: Home / Self Care | Payer: Self-pay | Source: Ambulatory Visit | Attending: Neurosurgery

## 2011-11-13 SURGERY — LUMBAR LAMINECTOMY/DECOMPRESSION MICRODISCECTOMY 1 LEVEL
Anesthesia: General | Site: Back | Laterality: Left

## 2011-12-06 ENCOUNTER — Other Ambulatory Visit: Payer: Self-pay | Admitting: Neurosurgery

## 2011-12-06 ENCOUNTER — Ambulatory Visit (HOSPITAL_COMMUNITY)
Admission: RE | Admit: 2011-12-06 | Discharge: 2011-12-06 | Disposition: A | Discharge status: Home / Self Care | Payer: BC Managed Care – PPO | Source: Ambulatory Visit | Attending: Neurosurgery | Admitting: Neurosurgery

## 2011-12-06 ENCOUNTER — Encounter (HOSPITAL_COMMUNITY)
Admission: RE | Admit: 2011-12-06 | Discharge: 2011-12-06 | Disposition: A | Discharge status: Home / Self Care | Payer: Worker's Compensation | Source: Ambulatory Visit | Attending: Neurosurgery | Admitting: Neurosurgery

## 2011-12-06 ENCOUNTER — Encounter (HOSPITAL_COMMUNITY): Payer: Self-pay

## 2011-12-06 DIAGNOSIS — Z01818 Encounter for other preprocedural examination: Secondary | ICD-10-CM | POA: Insufficient documentation

## 2011-12-06 HISTORY — DX: Hypoglycemia, unspecified: E16.2

## 2011-12-06 HISTORY — DX: Sleep apnea, unspecified: G47.30

## 2011-12-06 HISTORY — DX: Pneumonia, unspecified organism: J18.9

## 2011-12-06 HISTORY — DX: Migraine, unspecified, not intractable, without status migrainosus: G43.909

## 2011-12-06 HISTORY — DX: Alpha-1-antitrypsin deficiency: E88.01

## 2011-12-06 LAB — BASIC METABOLIC PANEL
BUN: 20 mg/dL (ref 6–23)
CO2: 29 mEq/L (ref 19–32)
Calcium: 9.5 mg/dL (ref 8.4–10.5)
Chloride: 100 mEq/L (ref 96–112)
Creatinine, Ser: 1.29 mg/dL (ref 0.50–1.35)
GFR calc Af Amer: 79 mL/min — ABNORMAL LOW (ref >90)

## 2011-12-06 LAB — SURGICAL PCR SCREEN
MRSA, PCR: NEGATIVE
Staphylococcus aureus: POSITIVE — AB

## 2011-12-06 LAB — CBC
HCT: 49.4 % (ref 39.0–52.0)
MCHC: 34.4 g/dL (ref 30.0–36.0)
MCV: 87.6 fL (ref 78.0–100.0)
Platelets: 237 10*3/uL (ref 150–400)
RDW: 13.1 % (ref 11.5–15.5)
WBC: 7.9 10*3/uL (ref 4.0–10.5)

## 2011-12-06 NOTE — Pre-Procedure Instructions (Signed)
20 [name]  12/06/2011   Your procedure is scheduled on:  Thursday November 7   Report to [facility] at 10:00 AM  Call this number if you have problems the morning of surgery: [phone]   Remember:   Do not eat or drink:After Midnight.    Take these medicines the morning of surgery with A SIP OF WATER: Gabapentin (Neurontin). Use Serevent and Flovent.    Do not wear jewelry, make-up or nail polish.  Do not wear lotions, powders, or perfumes. You may wear deodorant.  Do not shave 48 hours prior to surgery. Men may shave face and neck.  Do not bring valuables to the hospital.  Contacts, dentures or bridgework may not be worn into surgery.  Leave suitcase in the car. After surgery it may be brought to your room.  For patients admitted to the hospital, checkout time is 11:00 AM the day of discharge.   Patients discharged the day of surgery will not be allowed to drive home.  Name and phone number of your driver: NA  Special Instructions: Shower using CHG 2 nights before surgery and the night before surgery.  If you shower the day of surgery use CHG.  Use special wash - you have one bottle of CHG for all showers.  You should use approximately 1/3 of the bottle for each shower.   Please read over the following fact sheets that you were given: Pain Booklet, Coughing and Deep Breathing and Surgical Site Infection Prevention

## 2011-12-09 ENCOUNTER — Telehealth: Payer: Self-pay | Admitting: Internal Medicine

## 2011-12-11 ENCOUNTER — Telehealth: Payer: Self-pay | Admitting: Internal Medicine

## 2011-12-11 MED ORDER — AZITHROMYCIN 250 MG PO TABS: ORAL_TABLET | ORAL | Status: DC

## 2011-12-11 NOTE — Telephone Encounter (Signed)
[facility] Triage Call Report Triage Record Num: [id number] Operator: Tomasita Crumble Patient Name: [name] Call Date & Time: 12/09/2011 5:49:05PM Patient Phone: [phone] PCP: Darryll Capers Patient Gender: Male PCP Fax : [phone] Patient DOB: 07-26-1970 [facility] Name[facility] Reason for Call: Caller: [name]/Patient; PCP: Darryll Capers (Adults only); CB#: [phone]; Call regarding Upper Respiratory Infection; Onset symptoms "last weekend". Getting worse per caller; not able to sleep with CPAP, so he slept on couch. Caller states he spoke with pharmacy and called surgeon who advised he needs antibiotic and to call PCP. Surgery scheduled for 12/14/11 - something on his spine; history of spinal fusions. History of Staph in nose; has been on Rx for same - ointment in nose. Fever/ subjective with chills/ hot spells onset 12/08/11. Headache rated at 5 of 10 - has not taken headache medication for pain or fever. Nasal drainage reported as clear. Nonproductive cough. Symptoms worsened after 7 days. Reviewed practice profile. Advised caller he needs to see a provider within 24 hours. [facility]. Caller agreed. Protocol(s) Used: Upper Respiratory Infection (URI) Recommended Outcome per Protocol: See Provider within 24 hours Reason for Outcome: Symptoms worsen after 7 days or symptoms do not improve after 14 days of home care Care Advice: ~ Use a cool mist humidifier to moisten air. Be sure to clean according to manufacturer's instructions. ~ Consider use of a saline nasal spray per package directions to help relieve nasal congestion. ~ Warm fluids may help, or try a mixture of honey and lemon juice in warm tea. Coughing up mucus or phlegm helps to get rid of an infection. A productive cough should not be stopped. A cough medicine with guaifenesin (Robitussin, Mucinex) can help loosen the mucus. Cough medicine with dextromethorphan (DM) should be avoided.  Drinking lots of fluids can help loosen the mucus too, especially warm fluids. ~ Speak with your provider as soon as possible if: - any temperature elevation in a frail elderly or immunocompromised patient (such as diabetes, HIV/AIDS, renal disease, chemotherapy, organ transplant, or chronic steroid use). - pregnant and temperature elevation of 100.5 F (38C) or above. - fever does not respond despite 2 doses of fever reducing medication. - fever responds to home care but persists for 3 days or more. ~ 12/09/2011 6:05:07PM Page 1 of 1 [email or web address]

## 2011-12-11 NOTE — Telephone Encounter (Signed)
Pt was last seen 09-2010. Pt decline to see provider. Pt stated he has head cold and requesting [facility]. Pt is sch for spinal surgery on Thursday. Pt does not have co-pay. I informed pt will could bill him for co-pay.

## 2011-12-11 NOTE — Telephone Encounter (Signed)
Per dr jenkins-may have z pack 

## 2011-12-13 MED ORDER — DEXTROSE 5 % IV SOLN
3.0000 g | INTRAVENOUS | Status: AC
  Administered 2011-12-14: 3 g via INTRAVENOUS
  Filled 2011-12-13: qty 3000

## 2011-12-14 ENCOUNTER — Encounter (HOSPITAL_COMMUNITY)
Admission: RE | Disposition: A | Discharge status: Home / Self Care | Payer: Self-pay | Source: Ambulatory Visit | Attending: Neurosurgery

## 2011-12-14 ENCOUNTER — Ambulatory Visit (HOSPITAL_COMMUNITY): Payer: Worker's Compensation | Admitting: Anesthesiology

## 2011-12-14 ENCOUNTER — Ambulatory Visit (HOSPITAL_COMMUNITY): Payer: Worker's Compensation

## 2011-12-14 ENCOUNTER — Encounter (HOSPITAL_COMMUNITY): Payer: Self-pay | Admitting: Anesthesiology

## 2011-12-14 ENCOUNTER — Encounter (HOSPITAL_COMMUNITY): Payer: Self-pay | Admitting: *Deleted

## 2011-12-14 ENCOUNTER — Ambulatory Visit (HOSPITAL_COMMUNITY)
Admission: RE | Admit: 2011-12-14 | Discharge: 2011-12-16 | Disposition: A | Discharge status: Home / Self Care | Payer: Worker's Compensation | Source: Ambulatory Visit | Attending: Neurosurgery | Admitting: Neurosurgery

## 2011-12-14 DIAGNOSIS — Z981 Arthrodesis status: Secondary | ICD-10-CM | POA: Insufficient documentation

## 2011-12-14 DIAGNOSIS — J45909 Unspecified asthma, uncomplicated: Secondary | ICD-10-CM | POA: Insufficient documentation

## 2011-12-14 DIAGNOSIS — M5106 Intervertebral disc disorders with myelopathy, lumbar region: Secondary | ICD-10-CM

## 2011-12-14 DIAGNOSIS — M48061 Spinal stenosis, lumbar region without neurogenic claudication: Secondary | ICD-10-CM | POA: Insufficient documentation

## 2011-12-14 HISTORY — PX: LUMBAR LAMINECTOMY/DECOMPRESSION MICRODISCECTOMY: SHX5026

## 2011-12-14 SURGERY — LUMBAR LAMINECTOMY/DECOMPRESSION MICRODISCECTOMY 1 LEVEL
Anesthesia: General | Site: Back | Laterality: Left | Wound class: Clean

## 2011-12-14 MED ORDER — HYDROMORPHONE HCL PF 1 MG/ML IJ SOLN
INTRAMUSCULAR | Status: AC
  Filled 2011-12-14: qty 1

## 2011-12-14 MED ORDER — PROPOFOL 10 MG/ML IV BOLUS
INTRAVENOUS | Status: DC | PRN
  Administered 2011-12-14: 120 mg via INTRAVENOUS

## 2011-12-14 MED ORDER — KCL IN DEXTROSE-NACL 20-5-0.45 MEQ/L-%-% IV SOLN
80.0000 mL/h | INTRAVENOUS | Status: DC
  Filled 2011-12-14 (×5): qty 1000

## 2011-12-14 MED ORDER — FENTANYL CITRATE 0.05 MG/ML IJ SOLN
INTRAMUSCULAR | Status: DC | PRN
  Administered 2011-12-14: 50 ug via INTRAVENOUS
  Administered 2011-12-14 (×2): 100 ug via INTRAVENOUS

## 2011-12-14 MED ORDER — OXYCODONE HCL 5 MG PO TABS: 5.0000 mg | ORAL_TABLET | Freq: Once | ORAL | Status: DC | PRN

## 2011-12-14 MED ORDER — ACETAMINOPHEN 325 MG PO TABS: 650.0000 mg | ORAL_TABLET | ORAL | Status: DC | PRN

## 2011-12-14 MED ORDER — ZOLPIDEM TARTRATE 5 MG PO TABS
5.0000 mg | ORAL_TABLET | Freq: Every evening | ORAL | Status: DC | PRN
  Administered 2011-12-16: 5 mg via ORAL
  Filled 2011-12-14: qty 1

## 2011-12-14 MED ORDER — THROMBIN 5000 UNITS EX SOLR
CUTANEOUS | Status: DC | PRN
  Administered 2011-12-14: 5000 [IU] via TOPICAL

## 2011-12-14 MED ORDER — MEPERIDINE HCL 25 MG/ML IJ SOLN: 6.2500 mg | INTRAMUSCULAR | Status: DC | PRN

## 2011-12-14 MED ORDER — GLYCOPYRROLATE 0.2 MG/ML IJ SOLN
INTRAMUSCULAR | Status: DC | PRN
  Administered 2011-12-14: 0.6 mg via INTRAVENOUS

## 2011-12-14 MED ORDER — ACETAMINOPHEN 650 MG RE SUPP: 650.0000 mg | RECTAL | Status: DC | PRN

## 2011-12-14 MED ORDER — ARTIFICIAL TEARS OP OINT
TOPICAL_OINTMENT | OPHTHALMIC | Status: DC | PRN
  Administered 2011-12-14: 1 via OPHTHALMIC

## 2011-12-14 MED ORDER — CYCLOBENZAPRINE HCL 10 MG PO TABS
ORAL_TABLET | ORAL | Status: AC
  Filled 2011-12-14: qty 1

## 2011-12-14 MED ORDER — SODIUM CHLORIDE 0.9 % IR SOLN
Status: DC | PRN
  Administered 2011-12-14: 16:00:00

## 2011-12-14 MED ORDER — LIDOCAINE HCL (CARDIAC) 20 MG/ML IV SOLN
INTRAVENOUS | Status: DC | PRN
  Administered 2011-12-14: 100 mg via INTRAVENOUS

## 2011-12-14 MED ORDER — DEXAMETHASONE SODIUM PHOSPHATE 10 MG/ML IJ SOLN
INTRAMUSCULAR | Status: AC
  Administered 2011-12-14: 10 mg via INTRAVENOUS
  Filled 2011-12-14: qty 1

## 2011-12-14 MED ORDER — CEFAZOLIN SODIUM-DEXTROSE 2-3 GM-% IV SOLR
2.0000 g | Freq: Three times a day (TID) | INTRAVENOUS | Status: AC
  Administered 2011-12-14 – 2011-12-15 (×2): 2 g via INTRAVENOUS
  Filled 2011-12-14 (×2): qty 50

## 2011-12-14 MED ORDER — SALMETEROL XINAFOATE 50 MCG/DOSE IN AEPB
1.0000 | INHALATION_SPRAY | Freq: Two times a day (BID) | RESPIRATORY_TRACT | Status: DC
  Administered 2011-12-14 – 2011-12-16 (×4): 1 via RESPIRATORY_TRACT
  Filled 2011-12-14: qty 0

## 2011-12-14 MED ORDER — HEMOSTATIC AGENTS (NO CHARGE) OPTIME
TOPICAL | Status: DC | PRN
  Administered 2011-12-14: 1 via TOPICAL

## 2011-12-14 MED ORDER — ONDANSETRON HCL 4 MG/2ML IJ SOLN: 4.0000 mg | INTRAMUSCULAR | Status: DC | PRN

## 2011-12-14 MED ORDER — SODIUM CHLORIDE 0.9 % IV SOLN
INTRAVENOUS | Status: AC
  Filled 2011-12-14: qty 500

## 2011-12-14 MED ORDER — CYCLOBENZAPRINE HCL 10 MG PO TABS
10.0000 mg | ORAL_TABLET | Freq: Three times a day (TID) | ORAL | Status: DC | PRN
  Administered 2011-12-14 – 2011-12-15 (×3): 10 mg via ORAL
  Filled 2011-12-14 (×2): qty 1

## 2011-12-14 MED ORDER — BUPIVACAINE HCL (PF) 0.5 % IJ SOLN
INTRAMUSCULAR | Status: DC | PRN
  Administered 2011-12-14: 18 mL

## 2011-12-14 MED ORDER — HYDROMORPHONE HCL PF 1 MG/ML IJ SOLN
1.0000 mg | INTRAMUSCULAR | Status: DC | PRN
  Administered 2011-12-14: 1 mg via INTRAMUSCULAR
  Administered 2011-12-15: 1.5 mg via INTRAMUSCULAR
  Filled 2011-12-14: qty 1
  Filled 2011-12-14: qty 2

## 2011-12-14 MED ORDER — HYDROMORPHONE HCL 2 MG PO TABS
4.0000 mg | ORAL_TABLET | ORAL | Status: DC | PRN
  Administered 2011-12-14 – 2011-12-16 (×7): 4 mg via ORAL
  Filled 2011-12-14: qty 1
  Filled 2011-12-14 (×6): qty 2

## 2011-12-14 MED ORDER — BACITRACIN 50000 UNITS IM SOLR
INTRAMUSCULAR | Status: AC
  Filled 2011-12-14: qty 1

## 2011-12-14 MED ORDER — ROCURONIUM BROMIDE 100 MG/10ML IV SOLN
INTRAVENOUS | Status: DC | PRN
  Administered 2011-12-14: 50 mg via INTRAVENOUS
  Administered 2011-12-14: 20 mg via INTRAVENOUS

## 2011-12-14 MED ORDER — KETOROLAC TROMETHAMINE 30 MG/ML IJ SOLN
30.0000 mg | Freq: Four times a day (QID) | INTRAMUSCULAR | Status: DC
  Administered 2011-12-14 – 2011-12-16 (×7): 30 mg via INTRAVENOUS
  Filled 2011-12-14 (×9): qty 1

## 2011-12-14 MED ORDER — PHENOL 1.4 % MT LIQD
1.0000 | OROMUCOSAL | Status: DC | PRN
  Filled 2011-12-14: qty 177

## 2011-12-14 MED ORDER — FLUTICASONE PROPIONATE HFA 110 MCG/ACT IN AERO
1.0000 | INHALATION_SPRAY | Freq: Two times a day (BID) | RESPIRATORY_TRACT | Status: DC
  Administered 2011-12-14 – 2011-12-16 (×4): 1 via RESPIRATORY_TRACT
  Filled 2011-12-14: qty 12

## 2011-12-14 MED ORDER — OXYCODONE HCL 5 MG/5ML PO SOLN: 5.0000 mg | Freq: Once | ORAL | Status: DC | PRN

## 2011-12-14 MED ORDER — ONDANSETRON HCL 4 MG/2ML IJ SOLN: 4.0000 mg | Freq: Once | INTRAMUSCULAR | Status: DC | PRN

## 2011-12-14 MED ORDER — DULOXETINE HCL 60 MG PO CPEP
60.0000 mg | ORAL_CAPSULE | Freq: Two times a day (BID) | ORAL | Status: DC
  Administered 2011-12-14 – 2011-12-16 (×4): 60 mg via ORAL
  Filled 2011-12-14 (×6): qty 1

## 2011-12-14 MED ORDER — SODIUM CHLORIDE 0.9 % IJ SOLN: 3.0000 mL | INTRAMUSCULAR | Status: DC | PRN

## 2011-12-14 MED ORDER — LACTATED RINGERS IV SOLN
INTRAVENOUS | Status: DC | PRN
  Administered 2011-12-14 (×2): via INTRAVENOUS

## 2011-12-14 MED ORDER — MIDAZOLAM HCL 5 MG/5ML IJ SOLN
INTRAMUSCULAR | Status: DC | PRN
  Administered 2011-12-14: 2 mg via INTRAVENOUS

## 2011-12-14 MED ORDER — DEXAMETHASONE SODIUM PHOSPHATE 10 MG/ML IJ SOLN: 10.0000 mg | INTRAMUSCULAR | Status: DC

## 2011-12-14 MED ORDER — SODIUM CHLORIDE 0.9 % IJ SOLN
3.0000 mL | Freq: Two times a day (BID) | INTRAMUSCULAR | Status: DC
  Administered 2011-12-15 (×2): 3 mL via INTRAVENOUS

## 2011-12-14 MED ORDER — 0.9 % SODIUM CHLORIDE (POUR BTL) OPTIME
TOPICAL | Status: DC | PRN
  Administered 2011-12-14: 1000 mL

## 2011-12-14 MED ORDER — ONDANSETRON HCL 4 MG/2ML IJ SOLN
INTRAMUSCULAR | Status: DC | PRN
  Administered 2011-12-14: 4 mg via INTRAVENOUS

## 2011-12-14 MED ORDER — HYDROMORPHONE HCL PF 1 MG/ML IJ SOLN
0.2500 mg | INTRAMUSCULAR | Status: DC | PRN
  Administered 2011-12-14: 0.5 mg via INTRAVENOUS
  Administered 2011-12-14: 1 mg via INTRAVENOUS

## 2011-12-14 MED ORDER — MENTHOL 3 MG MT LOZG
1.0000 | LOZENGE | OROMUCOSAL | Status: DC | PRN
  Administered 2011-12-15: 3 mg via ORAL
  Filled 2011-12-14: qty 9

## 2011-12-14 MED ORDER — NEOSTIGMINE METHYLSULFATE 1 MG/ML IJ SOLN
INTRAMUSCULAR | Status: DC | PRN
  Administered 2011-12-14: 4 mg via INTRAVENOUS

## 2011-12-14 MED ORDER — HYDROMORPHONE HCL PF 1 MG/ML IJ SOLN
INTRAMUSCULAR | Status: DC | PRN
  Administered 2011-12-14 (×2): 1 mg via INTRAVENOUS

## 2011-12-14 MED ORDER — GABAPENTIN 300 MG PO CAPS
300.0000 mg | ORAL_CAPSULE | Freq: Three times a day (TID) | ORAL | Status: DC
  Administered 2011-12-14 – 2011-12-16 (×5): 300 mg via ORAL
  Filled 2011-12-14 (×8): qty 1

## 2011-12-14 SURGICAL SUPPLY — 49 items
BAG DECANTER FOR FLEXI CONT (MISCELLANEOUS) ×2 IMPLANT
BENZOIN TINCTURE PRP APPL 2/3 (GAUZE/BANDAGES/DRESSINGS) ×2 IMPLANT
BLADE SURG ROTATE 9660 (MISCELLANEOUS) ×2 IMPLANT
BRUSH SCRUB EZ PLAIN DRY (MISCELLANEOUS) ×2 IMPLANT
CANISTER SUCTION 2500CC (MISCELLANEOUS) ×2 IMPLANT
CLOTH BEACON ORANGE TIMEOUT ST (SAFETY) ×2 IMPLANT
CONT SPEC 4OZ CLIKSEAL STRL BL (MISCELLANEOUS) ×2 IMPLANT
DERMABOND ADHESIVE PROPEN (GAUZE/BANDAGES/DRESSINGS) ×1
DERMABOND ADVANCED (GAUZE/BANDAGES/DRESSINGS) ×1
DERMABOND ADVANCED .7 DNX12 (GAUZE/BANDAGES/DRESSINGS) ×1 IMPLANT
DERMABOND ADVANCED .7 DNX6 (GAUZE/BANDAGES/DRESSINGS) ×1 IMPLANT
DRAPE LAPAROTOMY 100X72X124 (DRAPES) ×2 IMPLANT
DRAPE MICROSCOPE ZEISS OPMI (DRAPES) ×2 IMPLANT
DRAPE SURG 17X23 STRL (DRAPES) ×4 IMPLANT
DRESSING TELFA 8X3 (GAUZE/BANDAGES/DRESSINGS) ×2 IMPLANT
ELECT BLADE 4.0 EZ CLEAN MEGAD (MISCELLANEOUS) ×2
ELECT REM PT RETURN 9FT ADLT (ELECTROSURGICAL) ×2
ELECTRODE BLDE 4.0 EZ CLN MEGD (MISCELLANEOUS) ×1 IMPLANT
ELECTRODE REM PT RTRN 9FT ADLT (ELECTROSURGICAL) ×1 IMPLANT
GAUZE SPONGE 4X4 16PLY XRAY LF (GAUZE/BANDAGES/DRESSINGS) IMPLANT
GLOVE BIO SURGEON STRL SZ 6.5 (GLOVE) ×4 IMPLANT
GLOVE BIO SURGEON STRL SZ8 (GLOVE) ×2 IMPLANT
GLOVE BIOGEL PI IND STRL 7.0 (GLOVE) ×1 IMPLANT
GLOVE BIOGEL PI IND STRL 8.5 (GLOVE) ×1 IMPLANT
GLOVE BIOGEL PI INDICATOR 7.0 (GLOVE) ×1
GLOVE BIOGEL PI INDICATOR 8.5 (GLOVE) ×1
GLOVE ECLIPSE 7.5 STRL STRAW (GLOVE) ×2 IMPLANT
GOWN BRE IMP SLV AUR LG STRL (GOWN DISPOSABLE) ×4 IMPLANT
GOWN BRE IMP SLV AUR XL STRL (GOWN DISPOSABLE) ×2 IMPLANT
GOWN STRL REIN 2XL LVL4 (GOWN DISPOSABLE) IMPLANT
KIT BASIN OR (CUSTOM PROCEDURE TRAY) ×2 IMPLANT
KIT ROOM TURNOVER OR (KITS) ×2 IMPLANT
NEEDLE HYPO 22GX1.5 SAFETY (NEEDLE) ×2 IMPLANT
NEEDLE SPNL 22GX3.5 QUINCKE BK (NEEDLE) ×4 IMPLANT
NS IRRIG 1000ML POUR BTL (IV SOLUTION) ×2 IMPLANT
PACK LAMINECTOMY NEURO (CUSTOM PROCEDURE TRAY) ×2 IMPLANT
PAD ARMBOARD 7.5X6 YLW CONV (MISCELLANEOUS) ×6 IMPLANT
PATTIES SURGICAL .75X.75 (GAUZE/BANDAGES/DRESSINGS) ×2 IMPLANT
RUBBERBAND STERILE (MISCELLANEOUS) ×4 IMPLANT
SPONGE GAUZE 4X4 12PLY (GAUZE/BANDAGES/DRESSINGS) ×2 IMPLANT
SPONGE SURGIFOAM ABS GEL SZ50 (HEMOSTASIS) ×2 IMPLANT
STRIP CLOSURE SKIN 1/2X4 (GAUZE/BANDAGES/DRESSINGS) ×2 IMPLANT
SUT VIC AB 2-0 OS6 18 (SUTURE) ×8 IMPLANT
SUT VIC AB 3-0 CP2 18 (SUTURE) ×2 IMPLANT
SYR 20ML ECCENTRIC (SYRINGE) ×2 IMPLANT
TOOL FLUTED BALL 7MM (MISCELLANEOUS) ×2 IMPLANT
TOWEL OR 17X24 6PK STRL BLUE (TOWEL DISPOSABLE) ×2 IMPLANT
TOWEL OR 17X26 10 PK STRL BLUE (TOWEL DISPOSABLE) ×2 IMPLANT
WATER STERILE IRR 1000ML POUR (IV SOLUTION) ×2 IMPLANT

## 2011-12-14 NOTE — Transfer of Care (Signed)
Immediate Anesthesia Transfer of Care Note  Patient: [name]  Procedure(s) Performed: Procedure(s) (LRB) with comments: LUMBAR LAMINECTOMY/DECOMPRESSION MICRODISCECTOMY 1 LEVEL (Left) - Left Lumbar Five-Sacral One Decompressive Foraminotomy  Patient Location: PACU  Anesthesia Type:General  Level of Consciousness: awake, alert  and oriented  Airway & Oxygen Therapy: Patient Spontanous Breathing and Patient connected to nasal cannula oxygen  Post-op Assessment: Report given to PACU RN and Post -op Vital signs reviewed and stable  Post vital signs: Reviewed and stable  Complications: No apparent anesthesia complications

## 2011-12-14 NOTE — Plan of Care (Signed)
Problem: Consults Goal: Diagnosis - Spinal Surgery Outcome: Completed/Met Date Met:  12/14/11 Lumbar Laminectomy (Complex)

## 2011-12-14 NOTE — Anesthesia Postprocedure Evaluation (Signed)
Anesthesia Post Note  Patient: [name]  Procedure(s) Performed: Procedure(s) (LRB): LUMBAR LAMINECTOMY/DECOMPRESSION MICRODISCECTOMY 1 LEVEL (Left)  Anesthesia type: general  Patient location: PACU  Post pain: Pain level controlled  Post assessment: Patient's Cardiovascular Status Stable  Last Vitals:  Filed Vitals:   12/14/11 1730  BP:   Pulse: 79  Temp:   Resp: 11    Post vital signs: Reviewed and stable  Level of consciousness: sedated  Complications: No apparent anesthesia complications

## 2011-12-14 NOTE — Preoperative (Signed)
Beta Blockers   Reason not to administer Beta Blockers:Not Applicable 

## 2011-12-14 NOTE — H&P (Signed)
[name] is an 41 y.o. male.   Chief Complaint: Left leg pain HPI: The patient is a 41 year old gentleman who was hurt on the job a few years ago. He has had fusions at L4-5 after which she did well. 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 lift on that side with pedicle screw fixation. He has had persistent left leg pain since that time and start aggressive conservative therapy without improvement. He underwent imaging studies which showed some persistent scar tissue and perhaps some nerve root compression within the foramen at L5-S1 on the left. After discussing the options it was elected to explore the area drill through the facet almost completely to decompress the foramen and the nerves. He now comes for the left L5-S1 foraminal decompression. I've had a long discussion with him regarding the risks and benefits of surgical intervention. The risks discussed include but are not limited to bleeding infection weakness numbness paralysis spinal fluid leak persistent pain coma and death. We have discussed alternative methods of therapy along with the risks and benefits of nonintervention. He has had the opportunity to ask numerous questions and appears to understand. With this information in hand he has requested we proceed with surgery.   Past Medical History  Diagnosis Date  . Allergy   . Asthma   . Sinusitis, chronic   . Apnea   . Restless leg   . Spinal fusion failure     spinal fluid leak  . Pneumonia     has had 6-7 times  . Hypoglycemia   . Alpha-1-antitrypsin deficiency   . Migraines   . Sleep apnea     records on chart, severe, stops breathing 100+ times per night, uses cpap setting 18, warm air machine    Past Surgical History  Procedure Date  . Cholecystectomy   . Spinal fusion     l4-5 then l3-5 revision with failure  . Lumbar fusion L45 the L-S1 revision    spinal leak complication    Family History  Problem Relation Age  of Onset  . Coronary artery disease    . Hypertension Mother   . Hyperlipidemia Father   . Heart disease Father    Social History:  reports that he has never smoked. He does not have any smokeless tobacco history on file. He reports that he does not drink alcohol or use illicit drugs.  Allergies: No Known Allergies  Medications Prior to Admission  Medication Sig Dispense Refill  . azithromycin (ZITHROMAX) 250 MG tablet Take 2 today and 1 daily after today until completed  6 tablet  0  . clonazePAM (KLONOPIN) 1 MG tablet TAKE 1 TABLET BY MOUTH AT BEDTIME FOR SLEEP  30 tablet  2  . Dextromethorphan-Guaifenesin 20-400 MG TABS Take 1 capsule by mouth every 4 (four) hours as needed.      . diazepam (VALIUM) 10 MG tablet Take 1 tablet (10 mg total) by mouth every 3 (three) hours as needed.  30 tablet  0  . DULoxetine (CYMBALTA) 60 MG capsule Take 60 mg by mouth 2 (two) times daily.      Marland Kitchen gabapentin (NEURONTIN) 300 MG capsule TAK 1 TABLET BY MOUTH 4 TIMES A DAY  100 capsule  0  . HYDROmorphone (DILAUDID) 4 MG tablet Take 4 mg by mouth every 3 (three) hours as needed. For pain      . fluticasone (FLOVENT HFA) 110 MCG/ACT inhaler Inhale 1 puff into the  lungs 2 (two) times daily.      Marland Kitchen ketoprofen (ORUDIS) 75 MG capsule Take 75 mg by mouth 4 (four) times daily as needed.        . naproxen (NAPROSYN) 500 MG tablet Take 500 mg by mouth 2 (two) times daily as needed.        . salmeterol (SEREVENT) 50 MCG/DOSE diskus inhaler Inhale 1 puff into the lungs 2 (two) times daily.        No results found for this or any previous visit (from the past 48 hour(s)). No results found.  Review of systems not obtained due to patient factors.  Blood pressure 127/84, pulse 73, temperature 98.3 F (36.8 C), temperature source Oral, resp. rate 20, SpO2 96.00%.  The patient is awake alert and oriented. His no facial asymmetry. His gait is slow but nonantalgic. His reflexes are decreased but equal. 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 lift at that level. The plan is for exploration and decompression of the foramen.   Reinaldo Meeker, MD 12/14/2011, 2:11 PM

## 2011-12-14 NOTE — Op Note (Signed)
Preop diagnosis: Foraminal stenosis at L5 S1 left with L5 and S1 nerve root compression Postop diagnosis: Same Procedure: Left L5-S1 decompressive foraminotomy with decompression of L5 and S1 nerve roots Surgeon: Jozlin Bently Assistant: Venetia Maxon  After being placed the prone position the patient's back was prepped and draped in the usual sterile fashion. Previous lumbar incision was opened up and carried down until we could identify the midline. We did a subperiosteal dissection along the left side of the spinous processes residual lamina and facet joint identify the pedicle screw of L5 on the left. We then dissected inferior to skeletonize the residual facet joint. We then drilled of the facet joint then and then removed from a lateral to medial direction completely unroofing the foramen. There was a great deal of soft tissue within the foramen causing marked compression on the L5 nerve root this was removed and the L5 nerve root identified and found to be well decompressed. We then tracked the S1 nerve root out its foramen so that it was free as well. At this time we did incise the disc to make sure there was no soft material. A small amount was removed. We then removed the lip of the superior edge of the disc which was applied the L5 nerve root to give the L5 nerve root more room. At this time inspection was carried out once more in all directions for any evidence of residual compression and none could be identified. Large amounts of irrigation were carried out and any bleeding controlled with upper coagulation and Gelfoam. The wound was then closed in multiple layers of Vicryl on the muscle fascia subcutaneous and subcuticular tissues. Dermabond Steri-Strips were placed on the skin. A sterile dressing was then applied and the patient was extubated and taken to recovery room in stable condition.

## 2011-12-14 NOTE — Anesthesia Preprocedure Evaluation (Addendum)
Anesthesia Evaluation  Patient identified by MRN, date of birth, ID band Patient awake    Reviewed: Allergy & Precautions, H&P , NPO status , Patient's Chart, lab work & pertinent test results  History of Anesthesia Complications Negative for: history of anesthetic complications  Airway Mallampati: II TM Distance: >3 FB Neck ROM: Full    Dental   Pulmonary asthma , sleep apnea and Continuous Positive Airway Pressure Ventilation , pneumonia -, resolved, Recent URI , Resolved,          Cardiovascular     Neuro/Psych  Headaches, Depression    GI/Hepatic negative GI ROS, Neg liver ROS,   Endo/Other  negative endocrine ROSMorbid obesity  Renal/GU negative Renal ROS     Musculoskeletal   Abdominal   Peds  Hematology negative hematology ROS (+)   Anesthesia Other Findings   Reproductive/Obstetrics negative OB ROS                          Anesthesia Physical Anesthesia Plan  ASA: III  Anesthesia Plan: General   Post-op Pain Management:    Induction: Intravenous  Airway Management Planned: Oral ETT  Additional Equipment:   Intra-op Plan:   Post-operative Plan: Extubation in OR  Informed Consent: I have reviewed the patients History and Physical, chart, labs and discussed the procedure including the risks, benefits and alternatives for the proposed anesthesia with the patient or authorized representative who has indicated his/her understanding and acceptance.     Plan Discussed with: CRNA and Surgeon  Anesthesia Plan Comments:         Anesthesia Quick Evaluation

## 2011-12-15 ENCOUNTER — Encounter (HOSPITAL_COMMUNITY): Payer: Self-pay | Admitting: Neurosurgery

## 2011-12-15 NOTE — Progress Notes (Signed)
Patient ID: [name], male   DOB: Apr 25, 1970, 41 y.o.   MRN: [id number] Subjective: Patient reports lots of pain  Objective: Vital signs in last 24 hours: Temp:  [97.4 F (36.3 C)-98.6 F (37 C)] 97.4 F (36.3 C) (11/08 1143) Pulse Rate:  [53-95] 75  (11/08 1143) Resp:  [6-21] 18  (11/08 1143) BP: (95-143)/(57-88) 116/70 mmHg (11/08 1143) SpO2:  [92 %-97 %] 97 % (11/08 1143)  Intake/Output from previous day: 11/07 0701 - 11/08 0700 In: 1940 [P.O.:240; I.V.:1700] Out: 500 [Urine:400; Blood:100] Intake/Output this shift: Total I/O In: 600 [P.O.:600] Out: -   Wound:clean and dry  Lab Results: No results found for this basename: WBC:2,HGB:2,HCT:2,PLT:2 in the last 72 hours BMET No results found for this basename: NA:2,K:2,CL:2,CO2:2,GLUCOSE:2,BUN:2,CREATININE:2,CALCIUM:2 in the last 72 hours  Studies/Results: Dg Lumbar Spine 1 View  12/14/2011  *RADIOLOGY REPORT*  Clinical Data: Left L5-S1 foraminotomy.  OPERATIVE LUMBAR SPINE - 1 VIEW  Comparison: Bone window images from CT abdomen and pelvis 10/12/2010 and lumbar CT myelogram 06/15/2010.  Findings: Single lateral image obtained at 1605 hours and submitted for interpretation postoperatively demonstrates prior fusion at L4- 5 and L5-S1.  The localizer probe is directed toward the L5-S1 disc space posteriorly.  IMPRESSION: Intraoperative localization of L5-S1.   Original Report Authenticated By: Hulan Saas, M.D.     Assessment/Plan: Doing fair at best. Lots more pain than I would expect. Says left leg feels the same as before surgery. Neuro without changes. Will increase activity, and plan on d/c tomorrow.  LOS: 1 day  as above   Reinaldo Meeker, MD 12/15/2011, 12:05 PM

## 2011-12-16 MED ORDER — HYDROMORPHONE HCL 4 MG PO TABS: 4.0000 mg | ORAL_TABLET | ORAL | Status: DC | PRN

## 2011-12-16 MED ORDER — CYCLOBENZAPRINE HCL 10 MG PO TABS: 10.0000 mg | ORAL_TABLET | Freq: Three times a day (TID) | ORAL | Status: AC | PRN

## 2011-12-16 NOTE — Care Management Note (Signed)
    Page 1 of 1   12/16/2011     4:13:55 PM   CARE MANAGEMENT NOTE 12/16/2011  Patient:  TITO, PARKHOUSE   Account Number:  [id number]  Date Initiated:  12/16/2011  Documentation initiated by:  [facility]  Subjective/Objective Assessment:   Left L5 S1 decompression     Action/Plan:   lives at home with wife   Anticipated DC Date:  12/16/2011   Anticipated DC Plan:  HOME W HOME HEALTH SERVICES      DC Planning Services  CM consult      Choice offered to / List presented to:     DME arranged  [facility]      DME agency  [facility]        Status of service:  Completed, signed off Medicare Important Message given?   (If response is "NO", the following Medicare IM given date fields will be blank) Date Medicare IM given:   Date Additional Medicare IM given:    Discharge Disposition:  HOME W HOME HEALTH SERVICES  Per UR Regulation:    If discussed at Long Length of Stay Meetings, dates discussed:    Comments:  12/16/2011 1600 NCM contacted Tonya and left message for return call. Will need to fax her d/c [facility] and DME request. Isidoro Donning RN CCM Case Mgmt phone [phone]  12/16/2011 1000 Pt states his Worker's Comp is [employer] # [phone]. Pt is requesting HH aide for assistance at home because his wife works. Requesting cane for home. Unit RN received order from Dr. Gerlene Fee for DME. [facility] notified for cane need for scheduled d/c home today. Isidoro Donning RN CCM Case Mgmt phone [phone]

## 2011-12-16 NOTE — Discharge Summary (Signed)
Physician Discharge Summary  Patient ID: [name] MRN: [id number] DOB/AGE: 40-Aug-1972 40 y.o.  Admit date: 12/14/2011 Discharge date: 12/16/2011  Admission Diagnoses:  Discharge Diagnoses:  Active Problems:  * No active hospital problems. *    Discharged Condition: good  Hospital Course: Surgery Thursday. Did fair. Lots of post op pain. Wound healing fine. Ambulated well inspite of pain. Ready for d/c post op day 2. Specific instructions given.  Consults: None  Significant Diagnostic Studies: none   Treatments: surgery: Left L5 S1 decompression   Discharge Exam: Blood pressure 90/58, pulse 68, temperature 98.7 F (37.1 C), temperature source Oral, resp. rate 18, SpO2 93.00%. Incision/Wound:clean and dry   Disposition: 01-Home or Self Care  Discharge Orders    Future Appointments: Provider: Department: Dept Phone: Center:   02/12/2012 9:15 AM [facility] [place] [phone] [facility]   02/19/2012 2:45 PM Stacie Glaze, MD [facility] [place] [phone] [facility]     Future Orders Please Complete By Expires   Diet general      Discharge instructions      Comments:   Mostly bedrest. Get up 9 or 10 times each day and walk for 15-20 minutes each time. Very little sitting the first week. No riding in the car until your first post op appointment. If you had neck surgery...may shower from the chest down. If you had low back surgery....you may shower with a saran wrap covering over the incision. Take your pain medicine as needed...and other medicines that you are instructed to take. Call for an appointment...[phone].   Call MD for:  temperature >100.4      Call MD for:  persistant nausea and vomiting      Call MD for:  severe uncontrolled pain      Call MD for:  redness, tenderness, or signs of infection (pain, swelling, redness, odor or green/yellow discharge around incision site)      Call MD for:  difficulty breathing,  headache or visual disturbances      Call MD for:  hives          Medication List     As of 12/16/2011  9:21 AM    STOP taking these medications         azithromycin 250 MG tablet   Commonly known as: ZITHROMAX      diazepam 10 MG tablet   Commonly known as: VALIUM      naproxen 500 MG tablet   Commonly known as: NAPROSYN      TAKE these medications         clonazePAM 1 MG tablet   Commonly known as: KLONOPIN   TAKE 1 TABLET BY MOUTH AT BEDTIME FOR SLEEP      cyclobenzaprine 10 MG tablet   Commonly known as: FLEXERIL   Take 1 tablet (10 mg total) by mouth 3 (three) times daily as needed for muscle spasms.      Dextromethorphan-Guaifenesin 20-400 MG Tabs   Take 1 capsule by mouth every 4 (four) hours as needed.      DULoxetine 60 MG capsule   Commonly known as: CYMBALTA   Take 60 mg by mouth 2 (two) times daily.      fluticasone 110 MCG/ACT inhaler   Commonly known as: FLOVENT HFA   Inhale 1 puff into the lungs 2 (two) times daily.      gabapentin 300 MG capsule   Commonly known as: NEURONTIN   TAK 1 TABLET BY MOUTH 4  TIMES A DAY      HYDROmorphone 4 MG tablet   Commonly known as: DILAUDID   Take 4 mg by mouth every 3 (three) hours as needed. For pain      HYDROmorphone 4 MG tablet   Commonly known as: DILAUDID   Take 1 tablet (4 mg total) by mouth every 3 (three) hours as needed.      ketoprofen 75 MG capsule   Commonly known as: ORUDIS   Take 75 mg by mouth 4 (four) times daily as needed.      salmeterol 50 MCG/DOSE diskus inhaler   Commonly known as: SEREVENT   Inhale 1 puff into the lungs 2 (two) times daily.         At home rest most of the time. Get up 9 or 10 times each day and take a 15 or 20 minute walk. No riding in the car and to your first postoperative appointment. If you have neck surgery you may shower from the chest down starting on the third postoperative day. If you had back surgery he may start showering on the third postoperative day  with saran wrap wrapped around your incisional area 3 times. After the shower remove the saran wrap. Take pain medicine as needed and other medications as instructed. Call my office for an appointment.  SignedReinaldo Meeker, MD 12/16/2011, 9:21 AM

## 2011-12-16 NOTE — Progress Notes (Signed)
   CARE MANAGEMENT NOTE 12/16/2011  Patient:  [name], [name]   Account Number:  [id number]  Date Initiated:  12/16/2011  Documentation initiated by:  [facility]  Subjective/Objective Assessment:   Left L5 S1 decompression     Action/Plan:   lives at home with wife   Anticipated DC Date:  12/16/2011   Anticipated DC Plan:  HOME W HOME HEALTH SERVICES      DC Planning Services  CM consult      Choice offered to / List presented to:     DME arranged  [facility]      DME agency  [facility]        Status of service:  Completed, signed off Medicare Important Message given?   (If response is "NO", the following Medicare IM given date fields will be blank) Date Medicare IM given:   Date Additional Medicare IM given:    Discharge Disposition:  HOME W HOME HEALTH SERVICES  Per UR Regulation:    If discussed at Long Length of Stay Meetings, dates discussed:    Comments:  12/16/2011 1600 NCM contacted Tonya and left message for return call. Will need to fax her d/c [facility] and DME request. Isidoro Donning RN CCM Case Mgmt phone [phone]  12/16/2011 1000 Pt states his Worker's Comp is [employer] # [id number]. Pt is requesting HH aide for assistance at home because his wife works. Requesting cane for home. Unit RN received order from Dr. Gerlene Fee for DME. [facility] notified for cane need for scheduled d/c home today. Isidoro Donning RN CCM Case Mgmt phone [phone]

## 2011-12-18 NOTE — Progress Notes (Signed)
   CARE MANAGEMENT NOTE 12/18/2011  Patient:  [name], [name]   Account Number:  [id number]  Date Initiated:  12/16/2011  Documentation initiated by:  [facility]  Subjective/Objective Assessment:   Left L5 S1 decompression    Action/Plan:   lives at home with wife  Anticipated DC Date:  12/16/2011   Anticipated DC Plan:  HOME W HOME HEALTH SERVICES      DC Planning Services CM consult     Choice offered to / List presented to:     DME arranged CANE     DME agency [facility]       Status of service:  Completed, signed off Medicare Important Message given?   (If response is "NO", the following Medicare IM given date fields will be blank) Date Medicare IM given:   Date Additional Medicare IM given:    Discharge Disposition:  HOME W HOME HEALTH SERVICES  Per UR Regulation:    If discussed at Long Length of Stay Meetings, dates discussed:    Comments:  12/18/2011 1100 NCM contacted number on facesheet for follow on Worker's Comp. Bjorn Loser RN [place], [phone] ext [phone] is working with this pt. Requested info on DME, [facility] request and d/c summary. Fax # [phone]. NCM contacted pt. Spoke to pt to make him aware that NCM spoke to [employer]. Pt gave permission to fax requested info to [employer]. Faxed contact info to [facility] who to bill for cane. Provided info on referral sent to [facility] about payor.  Isidoro Donning RN CCM Case Mgmt phone [phone]  12/16/2011 1600 NCM contacted Tonya and left message for return call. Will need to fax her d/c [facility] and DME request. Isidoro Donning RN CCM Case Mgmt phone [phone]  12/16/2011 1000 Pt states his Worker's Comp is [employer] # [phone]. Pt is requesting HH aide for assistance at home because his wife works. Requesting cane for home. Unit RN received order from Dr. Gerlene Fee for DME. [facility] notified for cane need for scheduled d/c home today. Isidoro Donning RN CCM Case Mgmt phone [phone]

## 2012-02-12 ENCOUNTER — Other Ambulatory Visit (INDEPENDENT_AMBULATORY_CARE_PROVIDER_SITE_OTHER): Payer: BC Managed Care – PPO

## 2012-02-12 DIAGNOSIS — Z0000 Encounter for general adult medical examination without abnormal findings: Secondary | ICD-10-CM

## 2012-02-12 LAB — POCT URINALYSIS DIPSTICK
Blood, UA: NEGATIVE
Glucose, UA: NEGATIVE
Leukocytes, UA: NEGATIVE
Nitrite, UA: NEGATIVE
Urobilinogen, UA: 1
pH, UA: 5.5

## 2012-02-12 LAB — HEPATIC FUNCTION PANEL
AST: 84 U/L — ABNORMAL HIGH (ref 0–37)
Albumin: 3.3 g/dL — ABNORMAL LOW (ref 3.5–5.2)
Total Bilirubin: 0.7 mg/dL (ref 0.3–1.2)

## 2012-02-12 LAB — CBC WITH DIFFERENTIAL/PLATELET
Basophils Absolute: 0 10*3/uL (ref 0.0–0.1)
HCT: 44.2 % (ref 39.0–52.0)
Hemoglobin: 15.4 g/dL (ref 13.0–17.0)
Lymphs Abs: 1.7 10*3/uL (ref 0.7–4.0)
MCHC: 34.8 g/dL (ref 30.0–36.0)
MCV: 86.8 fl (ref 78.0–100.0)
Monocytes Absolute: 0.9 10*3/uL (ref 0.1–1.0)
Monocytes Relative: 15.3 % — ABNORMAL HIGH (ref 3.0–12.0)
Neutro Abs: 2.8 10*3/uL (ref 1.4–7.7)
Platelets: 224 10*3/uL (ref 150.0–400.0)
RDW: 13.3 % (ref 11.5–14.6)

## 2012-02-12 LAB — LIPID PANEL
Total CHOL/HDL Ratio: 4
Triglycerides: 63 mg/dL (ref 0.0–149.0)

## 2012-02-12 LAB — MICROALBUMIN / CREATININE URINE RATIO
Creatinine,U: 204.7 mg/dL
Microalb Creat Ratio: 0.2 mg/g (ref 0.0–30.0)

## 2012-02-12 LAB — BASIC METABOLIC PANEL
BUN: 17 mg/dL (ref 6–23)
CO2: 29 mEq/L (ref 19–32)
Chloride: 105 mEq/L (ref 96–112)
Creatinine, Ser: 1.4 mg/dL (ref 0.4–1.5)
Glucose, Bld: 93 mg/dL (ref 70–99)
Potassium: 3.7 mEq/L (ref 3.5–5.1)

## 2012-02-12 LAB — TSH: TSH: 1.1 u[IU]/mL (ref 0.35–5.50)

## 2012-02-12 LAB — HEMOGLOBIN A1C: Hgb A1c MFr Bld: 5.2 % (ref 4.6–6.5)

## 2012-02-12 NOTE — Addendum Note (Signed)
Addended by: Bonnye Fava on: 02/12/2012 09:23 AM   Modules accepted: Orders

## 2012-02-19 ENCOUNTER — Encounter: Payer: Self-pay | Admitting: Internal Medicine

## 2012-02-19 ENCOUNTER — Ambulatory Visit (INDEPENDENT_AMBULATORY_CARE_PROVIDER_SITE_OTHER): Payer: BC Managed Care – PPO | Admitting: Internal Medicine

## 2012-02-19 VITALS — BP 116/74 | HR 88 | Temp 98.2°F | Resp 18 | Ht 74.0 in | Wt 326.0 lb

## 2012-02-19 DIAGNOSIS — E8801 Alpha-1-antitrypsin deficiency: Secondary | ICD-10-CM

## 2012-02-19 DIAGNOSIS — G8929 Other chronic pain: Secondary | ICD-10-CM

## 2012-02-19 DIAGNOSIS — Z0000 Encounter for general adult medical examination without abnormal findings: Secondary | ICD-10-CM

## 2012-02-19 MED ORDER — GABAPENTIN 300 MG PO CAPS: 600.0000 mg | ORAL_CAPSULE | Freq: Three times a day (TID) | ORAL | Status: DC

## 2012-02-19 MED ORDER — KETOPROFEN 75 MG PO CAPS: 75.0000 mg | ORAL_CAPSULE | Freq: Three times a day (TID) | ORAL | Status: DC | PRN

## 2012-02-19 NOTE — Patient Instructions (Addendum)
The patient is instructed to continue all medications as prescribed. Schedule followup with check out clerk upon leaving the clinic  

## 2012-02-19 NOTE — Progress Notes (Signed)
Subjective:    Patient ID: [name], male    DOB: 13-Jan-1971, 42 y.o.   MRN: [id number]  HPI Patient present for CPX He still has significant back pain despite two back surgeries since he was last seen. We disucssed nerve pain and ways to control it   Review of Systems  Constitutional: Negative for fever and fatigue.  HENT: Negative for hearing loss, congestion, neck pain and postnasal drip.   Eyes: Negative for discharge, redness and visual disturbance.  Respiratory: Negative for cough, shortness of breath and wheezing.   Cardiovascular: Negative for leg swelling.  Gastrointestinal: Negative for abdominal pain, constipation and abdominal distention.  Genitourinary: Negative for urgency and frequency.  Musculoskeletal: Negative for joint swelling and arthralgias.  Skin: Negative for color change and rash.  Neurological: Negative for weakness and light-headedness.  Hematological: Negative for adenopathy.  Psychiatric/Behavioral: Negative for behavioral problems.   Past Medical History  Diagnosis Date  . Allergy   . Asthma   . Sinusitis, chronic   . Apnea   . Restless leg   . Spinal fusion failure     spinal fluid leak  . Pneumonia     has had 6-7 times  . Hypoglycemia   . Alpha-1-antitrypsin deficiency   . Migraines   . Sleep apnea     records on chart, severe, stops breathing 100+ times per night, uses cpap setting 18, warm air machine    History   Social History  . Marital Status: Married    Spouse Name: N/A    Number of Children: N/A  . Years of Education: N/A   Occupational History  . Not on file.   Social History Main Topics  . Smoking status: Never Smoker   . Smokeless tobacco: Not on file  . Alcohol Use: No  . Drug Use: No  . Sexually Active: Yes   Other Topics Concern  . Not on file   Social History Narrative  . No narrative on file    Past Surgical History  Procedure Date  . Cholecystectomy   . Spinal fusion     l4-5 then l3-5  revision with failure  . Lumbar fusion L45 the L-S1 revision    spinal leak complication  . Lumbar laminectomy/decompression microdiscectomy 12/14/2011    Procedure: LUMBAR LAMINECTOMY/DECOMPRESSION MICRODISCECTOMY 1 LEVEL;  Surgeon: Reinaldo Meeker, MD;  Location: [facility];  Service: Neurosurgery;  Laterality: Left;  Left Lumbar Five-Sacral One Decompressive Foraminotomy    Family History  Problem Relation Age of Onset  . Coronary artery disease    . Hypertension Mother   . Hyperlipidemia Father   . Heart disease Father     No Known Allergies  Current Outpatient Prescriptions on File Prior to Visit  Medication Sig Dispense Refill  . clonazePAM (KLONOPIN) 1 MG tablet TAKE 1 TABLET BY MOUTH AT BEDTIME FOR SLEEP  30 tablet  2  . cyclobenzaprine (FLEXERIL) 10 MG tablet Take 1 tablet (10 mg total) by mouth 3 (three) times daily as needed for muscle spasms.  60 tablet  2  . Dextromethorphan-Guaifenesin 20-400 MG TABS Take 1 capsule by mouth every 4 (four) hours as needed.      . DULoxetine (CYMBALTA) 60 MG capsule Take 60 mg by mouth 2 (two) times daily.      . fluticasone (FLOVENT HFA) 110 MCG/ACT inhaler Inhale 1 puff into the lungs 2 (two) times daily.      Marland Kitchen gabapentin (NEURONTIN) 300 MG capsule Take 2 capsules (600  mg total) by mouth 3 (three) times daily.  100 capsule  0  . HYDROmorphone (DILAUDID) 4 MG tablet Take 1 tablet (4 mg total) by mouth every 3 (three) hours as needed.  100 tablet  0  . ketoprofen (ORUDIS) 75 MG capsule Take 75 mg by mouth 4 (four) times daily as needed.        . metoCLOPramide (REGLAN) 5 MG tablet Take 5 mg by mouth 4 (four) times daily as needed.      . salmeterol (SEREVENT) 50 MCG/DOSE diskus inhaler Inhale 1 puff into the lungs 2 (two) times daily.      . SUMAtriptan (IMITREX) 100 MG tablet Take 100 mg by mouth every 2 (two) hours as needed.        BP 116/74  Pulse 88  Temp 98.2 F (36.8 C)  Resp 18  Ht 6\' 2"  (1.88 m)  Wt 326 lb (147.873 kg)   BMI 41.86 kg/m2       Objective:   Physical Exam  Nursing note reviewed. Constitutional: He is oriented to person, place, and time. He appears well-developed and well-nourished.       obese  HENT:  Head: Normocephalic and atraumatic.  Eyes: Conjunctivae normal are normal. Pupils are equal, round, and reactive to light.  Neck: Normal range of motion. Neck supple.  Cardiovascular: Normal rate and regular rhythm.   Pulmonary/Chest: Effort normal and breath sounds normal.  Abdominal: Soft. Bowel sounds are normal.  Genitourinary: Rectum normal and prostate normal.  Musculoskeletal: He exhibits tenderness.  Neurological: He is alert and oriented to person, place, and time.  Skin: Skin is warm and dry.          Assessment & Plan:   Patient presents for yearly preventative medicine examination.   all immunizations and health maintenance protocols were reviewed with the patient and they are up to date with these protocols.   screening laboratory values were reviewed with the patient including screening of hyperlipidemia PSA renal function and hepatic function.   There medications past medical history social history problem list and allergies were reviewed in detail.   Goals were established with regard to weight loss exercise diet in compliance with medications   Fatty liver vs medication side effect with history of alpha 1 antitrypsin deficiency Good lipids Increase neurotin to 600 tid Reduce tylenol load Change to butrans

## 2012-02-22 ENCOUNTER — Other Ambulatory Visit: Payer: Self-pay | Admitting: Internal Medicine

## 2012-02-23 ENCOUNTER — Telehealth: Payer: Self-pay | Admitting: *Deleted

## 2012-02-23 NOTE — Telephone Encounter (Signed)
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

## 2012-03-13 ENCOUNTER — Telehealth: Payer: Self-pay | Admitting: Internal Medicine

## 2012-03-13 ENCOUNTER — Other Ambulatory Visit: Payer: Self-pay | Admitting: *Deleted

## 2012-03-13 MED ORDER — BENZONATATE 200 MG PO CAPS: 200.0000 mg | ORAL_CAPSULE | Freq: Two times a day (BID) | ORAL | Status: DC | PRN

## 2012-03-13 NOTE — Telephone Encounter (Signed)
Patient is having non stop coughing that does not improve with his inhaler.

## 2012-03-13 NOTE — Telephone Encounter (Signed)
Per dr Lovell Sheehan may have tessalon pearles 200 #[phone]

## 2012-03-13 NOTE — Telephone Encounter (Signed)
Patient Information:  Caller Name: Dequann  Phone: [phone]  Patient: Kenniel, Bergsma  Gender: Male  DOB: Dec 08, 1970  Age: 42 Years  PCP: Darryll Capers (Adults only)  Office Follow Up:  Does the office need to follow up with this patient?: Yes  Instructions For The Office: No appts. available. He "prefers" that medication be called in and not have to come to the office.   Symptoms  Reason For Call & Symptoms: Had a sore throat and ear pain over the weekend, 2/1 and that cleared with Sudafed.  Now has a cough, non-productive.  No fever.  Denies any wheezing.  Reviewed Health History In EMR: Yes  Reviewed Medications In EMR: Yes  Reviewed Allergies In EMR: Yes  Reviewed Surgeries / Procedures: Yes  Date of Onset of Symptoms: 03/11/2012  Treatments Tried: Sudafed, Robitussin DM  Treatments Tried Worked: No  Guideline(s) Used:  Asthma Attack  Disposition Per Guideline:   See Today in Office  Reason For Disposition Reached:   Coughing continuously (nonstop) that keeps from working or sleeping, and not improved after inhaler or nebulizer  Advice Given:  N/A

## 2012-05-16 ENCOUNTER — Other Ambulatory Visit: Payer: Self-pay | Admitting: Internal Medicine

## 2012-05-18 ENCOUNTER — Other Ambulatory Visit: Payer: Self-pay | Admitting: Internal Medicine

## 2012-05-27 ENCOUNTER — Ambulatory Visit: Payer: Self-pay | Admitting: Internal Medicine

## 2012-08-08 ENCOUNTER — Other Ambulatory Visit: Payer: Self-pay | Admitting: Internal Medicine

## 2012-08-27 ENCOUNTER — Other Ambulatory Visit: Payer: Self-pay | Admitting: Internal Medicine

## 2012-09-13 ENCOUNTER — Encounter: Payer: Self-pay | Admitting: Internal Medicine

## 2012-09-13 ENCOUNTER — Ambulatory Visit (INDEPENDENT_AMBULATORY_CARE_PROVIDER_SITE_OTHER): Payer: BC Managed Care – PPO | Admitting: Internal Medicine

## 2012-09-13 VITALS — BP 130/80 | HR 76 | Temp 98.2°F | Resp 16 | Ht 74.0 in | Wt 360.0 lb

## 2012-09-13 DIAGNOSIS — G5600 Carpal tunnel syndrome, unspecified upper limb: Secondary | ICD-10-CM

## 2012-09-13 DIAGNOSIS — G8929 Other chronic pain: Secondary | ICD-10-CM

## 2012-09-13 DIAGNOSIS — M722 Plantar fascial fibromatosis: Secondary | ICD-10-CM

## 2012-09-13 MED ORDER — GABAPENTIN 300 MG PO CAPS: 600.0000 mg | ORAL_CAPSULE | Freq: Three times a day (TID) | ORAL | Status: DC

## 2012-09-13 MED ORDER — METHYLPREDNISOLONE ACETATE 40 MG/ML IJ SUSP: 20.0000 mg | Freq: Once | INTRAMUSCULAR | Status: DC

## 2012-09-13 NOTE — Patient Instructions (Addendum)
Go to [facility] and get wrist braces for both wrists to wear at night For the right foot get a nighttime plantar fascial brace   I am going to refer you to a podiatrist    As Dr Gerlene Fee if he would consider changing the lyrica to topamax 50 bid

## 2012-09-13 NOTE — Progress Notes (Signed)
  Subjective:    Patient ID: [name], male    DOB: 08-Mar-1970, 42 y.o.   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  HENT: Negative for hearing loss, congestion, neck pain and postnasal drip.   Eyes: Negative for discharge, redness and visual disturbance.  Respiratory: Negative for cough, shortness of breath and wheezing.   Cardiovascular: Negative for leg swelling.  Gastrointestinal: Negative for abdominal pain, constipation and abdominal distention.  Genitourinary: Negative for urgency and frequency.  Musculoskeletal: Negative for joint swelling and arthralgias.  Skin: Negative for color change and rash.  Neurological: Negative for weakness and light-headedness.  Hematological: Negative for adenopathy.  Psychiatric/Behavioral: Negative for behavioral problems.       Objective:   Physical Exam  Nursing note and vitals reviewed. Constitutional: He appears well-developed and well-nourished.  HENT:  Head: Normocephalic and atraumatic.  Eyes: Conjunctivae are normal. Pupils are equal, round, and reactive to light.  Neck: Normal range of motion. Neck supple.  Cardiovascular: Normal rate and regular rhythm.   Pulmonary/Chest: Effort normal and breath sounds normal.  Abdominal: Soft. Bowel sounds are normal.          Assessment & Plan:  We did bilateral injections into the carpal space to see if we can alleviate some of the pain today.  He'll be referred for an EMG and nerve conduction velocity and possible referral to a hand specialist if the diagnosis is confirmed  Refer to foot specialist for plantat facial pain

## 2012-09-13 NOTE — Addendum Note (Signed)
Addended by: Stacie Glaze on: 09/13/2012 01:49 PM   Modules accepted: Orders, Level of Service

## 2012-09-16 ENCOUNTER — Ambulatory Visit: Payer: Self-pay | Admitting: Neurology

## 2012-09-24 ENCOUNTER — Ambulatory Visit: Payer: Self-pay | Admitting: Neurology

## 2012-10-01 ENCOUNTER — Other Ambulatory Visit: Payer: Self-pay | Admitting: Internal Medicine

## 2012-12-02 ENCOUNTER — Telehealth: Payer: Self-pay | Admitting: Internal Medicine

## 2012-12-02 ENCOUNTER — Other Ambulatory Visit: Payer: Self-pay | Admitting: *Deleted

## 2012-12-02 MED ORDER — LORCASERIN HCL 10 MG PO TABS: 10.0000 mg | ORAL_TABLET | Freq: Two times a day (BID) | ORAL | Status: AC

## 2012-12-02 NOTE — Telephone Encounter (Signed)
belviq 10 bid call in as per dr Jeannene Patella neurology   Was referral for carpel tunnel, but he forgot-podiatrist was for feet but he forgot app  I told patient he could call and reschedule

## 2012-12-02 NOTE — Telephone Encounter (Signed)
Pt states the lyrica he was previously on made him gain weight. Pt would like to know if there is a med he could take to help him lose weight  As they previously discussed.   Pt may have to cancel appt on 11/18 due to insurance. Pt would like to know the names of md he saw for corpel tunnel and foot issue. [facility] [place] [place]

## 2012-12-20 ENCOUNTER — Encounter: Payer: Self-pay | Admitting: *Deleted

## 2012-12-23 ENCOUNTER — Ambulatory Visit: Payer: Self-pay | Admitting: Internal Medicine

## 2012-12-31 ENCOUNTER — Encounter: Payer: Self-pay | Admitting: Family Medicine

## 2012-12-31 ENCOUNTER — Ambulatory Visit (INDEPENDENT_AMBULATORY_CARE_PROVIDER_SITE_OTHER): Payer: BC Managed Care – PPO | Admitting: Family Medicine

## 2012-12-31 VITALS — BP 120/80 | Temp 98.4°F | Wt 360.0 lb

## 2012-12-31 DIAGNOSIS — H109 Unspecified conjunctivitis: Secondary | ICD-10-CM

## 2012-12-31 DIAGNOSIS — Z23 Encounter for immunization: Secondary | ICD-10-CM

## 2012-12-31 MED ORDER — SULFACETAMIDE SODIUM 10 % OP SOLN: 1.0000 [drp] | OPHTHALMIC | Status: DC

## 2012-12-31 NOTE — Progress Notes (Signed)
Pre visit review using our clinic review tool, if applicable. No additional management support is needed unless otherwise documented below in the visit note. 

## 2012-12-31 NOTE — Progress Notes (Signed)
Chief Complaint  Patient presents with  . Eye Pain    right    HPI:  [name] is a 42 yo pt of Dr. Lovell Sheehan here for acute visit for:  Eye Irritation: -started in R eye a few days ago, now in both eyes -feels like sand paper in eyes, irritation, burning, watery -denies: fevers, chills, vision deficit, HA, nausea  ROS: See pertinent positives and negatives per HPI.  Past Medical History  Diagnosis Date  . Allergy   . Asthma   . Sinusitis, chronic   . Apnea   . Restless leg   . Spinal fusion failure     spinal fluid leak  . Pneumonia     has had 6-7 times  . Hypoglycemia   . Alpha-1-antitrypsin deficiency   . Migraines   . Sleep apnea     records on chart, severe, stops breathing 100+ times per night, uses cpap setting 18, warm air machine    Past Surgical History  Procedure Laterality Date  . Cholecystectomy    . Spinal fusion      l4-5 then l3-5 revision with failure  . Lumbar fusion  L45 the L-S1 revision    spinal leak complication  . Lumbar laminectomy/decompression microdiscectomy  12/14/2011    Procedure: LUMBAR LAMINECTOMY/DECOMPRESSION MICRODISCECTOMY 1 LEVEL;  Surgeon: Reinaldo Meeker, MD;  Location: [facility];  Service: Neurosurgery;  Laterality: Left;  Left Lumbar Five-Sacral One Decompressive Foraminotomy    Family History  Problem Relation Age of Onset  . Coronary artery disease    . Hypertension Mother   . Hyperlipidemia Father   . Heart disease Father     History   Social History  . Marital Status: Married    Spouse Name: N/A    Number of Children: N/A  . Years of Education: N/A   Social History Main Topics  . Smoking status: Never Smoker   . Smokeless tobacco: None  . Alcohol Use: No  . Drug Use: No  . Sexual Activity: Yes   Other Topics Concern  . None   Social History Narrative  . None    Current outpatient prescriptions:clonazePAM (KLONOPIN) 1 MG tablet, TAKE 1 TABLET BY MOUTH AT BEDTIME FOR SLEEP, Disp: 30 tablet,  Rfl: 1;  cyclobenzaprine (FLEXERIL) 10 MG tablet, Take 1 tablet (10 mg total) by mouth 3 (three) times daily as needed for muscle spasms., Disp: 60 tablet, Rfl: 2;  Dextromethorphan-Guaifenesin 20-400 MG TABS, Take 1 capsule by mouth every 4 (four) hours as needed., Disp: , Rfl:  diazepam (VALIUM) 5 MG tablet, TAKE 1 TABLET BY MOUTH EVERY 6HRS AS NEEDED, Disp: 30 tablet, Rfl: 3;  DULoxetine (CYMBALTA) 60 MG capsule, Take 60 mg by mouth 2 (two) times daily., Disp: , Rfl: ;  fluticasone (FLOVENT HFA) 110 MCG/ACT inhaler, Inhale 1 puff into the lungs 2 (two) times daily., Disp: , Rfl: ;  gabapentin (NEURONTIN) 300 MG capsule, Take 2 capsules (600 mg total) by mouth 3 (three) times daily., Disp: 120 capsule, Rfl: 11 HYDROmorphone (DILAUDID) 4 MG tablet, Take 1 tablet (4 mg total) by mouth every 3 (three) hours as needed., Disp: 100 tablet, Rfl: 0;  ketoprofen (ORUDIS) 75 MG capsule, Take 1 capsule (75 mg total) by mouth 3 (three) times daily as needed., Disp: 30 capsule, Rfl: ;  Lorcaserin HCl 10 MG TABS, Take 10 mg by mouth 2 (two) times daily., Disp: 60 tablet, Rfl: 3 metoCLOPramide (REGLAN) 5 MG tablet, Take 5 mg by mouth 4 (  four) times daily as needed., Disp: , Rfl: ;  salmeterol (SEREVENT) 50 MCG/DOSE diskus inhaler, Inhale 1 puff into the lungs 2 (two) times daily., Disp: , Rfl: ;  SUMAtriptan (IMITREX) 100 MG tablet, Take 100 mg by mouth every 2 (two) hours as needed., Disp: , Rfl:  sulfacetamide (BLEPH-10) 10 % ophthalmic solution, Place 1 drop into both eyes every 3 (three) hours while awake. For 7 days, Disp: 15 mL, Rfl: 0  EXAM:  Filed Vitals:   12/31/12 1427  BP: 120/80  Temp: 98.4 F (36.9 C)    Body mass index is 46.2 kg/(m^2).  GENERAL: vitals reviewed and listed above, alert, oriented, appears well hydrated and in no acute distress  HEENT: atraumatic, conjunttiva erythematous mild bilat with clear watery discharge, no obvious corneal defects, visual acuity grossly intact, difficult  optho exam due to small pupils, PERRLA, no obvious abnormalities on inspection of external nose and ears  NECK: no obvious masses on inspection  MS: moves all extremities without noticeable abnormality  PSYCH: pleasant and cooperative, no obvious depression or anxiety  ASSESSMENT AND PLAN:  Discussed the following assessment and plan:  Conjunctivitis - Plan: sulfacetamide (BLEPH-10) 10 % ophthalmic solution  -no alarm features, likely viral and advised to start with cool compresses and artificial tear and then abx drops if not improving -advised to see optho if worsening, vision changes or not improving -Patient advised to return or notify a doctor immediately if symptoms worsen or persist or new concerns arise.  Patient Instructions  Conjunctivitis Conjunctivitis is commonly called "pink eye." Conjunctivitis can be caused by bacterial or viral infection, allergies, or injuries. There is usually redness of the lining of the eye, itching, discomfort, and sometimes discharge. There may be deposits of matter along the eyelids. A viral infection usually causes a watery discharge, while a bacterial infection causes a yellowish, thick discharge. Pink eye is very contagious and spreads by direct contact. You may be given antibiotic eyedrops as part of your treatment. Before using your eye medicine, remove all drainage from the eye by washing gently with warm water and cotton balls. Continue to use the medication until you have awakened 2 mornings in a row without discharge from the eye. Do not rub your eye. This increases the irritation and helps spread infection. Use separate towels from other household members. Wash your hands with soap and water before and after touching your eyes. Use cold compresses to reduce pain and sunglasses to relieve irritation from light. Do not wear contact lenses or wear eye makeup until the infection is gone. SEEK MEDICAL CARE IF:   Your symptoms are not better after  3 days of treatment.  You have increased pain or trouble seeing.  The outer eyelids become very red or swollen. Document Released: 03/02/2004 Document Revised: 04/17/2011 Document Reviewed: 01/23/2005 [facility] Patient Information 2014 [place], Lona Kettle, Dahlia Client R.

## 2012-12-31 NOTE — Patient Instructions (Signed)

## 2013-02-26 ENCOUNTER — Other Ambulatory Visit: Payer: Self-pay | Admitting: Internal Medicine

## 2013-03-19 ENCOUNTER — Telehealth: Payer: Self-pay | Admitting: Internal Medicine

## 2013-03-19 NOTE — Telephone Encounter (Signed)
Pt is calling in regards to finding out the price of rx methylpredni solone acetate (depo-medrol) pt states dr.jenkins had given him the injection in his wrist. Pt no longer has insurance coverage but states the injection really helped him and he is now in severe pain and is in need of another injection.

## 2013-06-13 ENCOUNTER — Telehealth: Payer: Self-pay | Admitting: Internal Medicine

## 2013-06-13 ENCOUNTER — Encounter: Payer: Self-pay | Admitting: Internal Medicine

## 2013-06-13 ENCOUNTER — Ambulatory Visit (INDEPENDENT_AMBULATORY_CARE_PROVIDER_SITE_OTHER): Payer: BC Managed Care – PPO | Admitting: Internal Medicine

## 2013-06-13 VITALS — BP 120/80 | HR 70 | Temp 98.3°F | Resp 20 | Ht 74.0 in | Wt 371.0 lb

## 2013-06-13 DIAGNOSIS — G5600 Carpal tunnel syndrome, unspecified upper limb: Secondary | ICD-10-CM

## 2013-06-13 DIAGNOSIS — E669 Obesity, unspecified: Secondary | ICD-10-CM

## 2013-06-13 MED ORDER — ALBUTEROL SULFATE HFA 108 (90 BASE) MCG/ACT IN AERS
2.0000 | INHALATION_SPRAY | Freq: Four times a day (QID) | RESPIRATORY_TRACT | Status: AC | PRN
Start: 2013-06-13 — End: ?

## 2013-06-13 MED ORDER — SALMETEROL XINAFOATE 50 MCG/DOSE IN AEPB: 1.0000 | INHALATION_SPRAY | Freq: Two times a day (BID) | RESPIRATORY_TRACT | Status: AC

## 2013-06-13 MED ORDER — HYDROCODONE-HOMATROPINE 5-1.5 MG/5ML PO SYRP: 5.0000 mL | ORAL_SOLUTION | Freq: Four times a day (QID) | ORAL | Status: DC | PRN

## 2013-06-13 MED ORDER — DOXYCYCLINE HYCLATE 100 MG PO TABS: 100.0000 mg | ORAL_TABLET | Freq: Two times a day (BID) | ORAL | Status: DC

## 2013-06-13 MED ORDER — FLUTICASONE PROPIONATE HFA 110 MCG/ACT IN AERO: 1.0000 | INHALATION_SPRAY | Freq: Two times a day (BID) | RESPIRATORY_TRACT | Status: AC

## 2013-06-13 NOTE — Patient Instructions (Addendum)

## 2013-06-13 NOTE — Telephone Encounter (Signed)
Pt needs a letter stating the carpel tunnel is from his spinal injuries, lifting and pulling himself.  He needs this for his attorney.

## 2013-06-13 NOTE — Progress Notes (Signed)
Subjective:    Patient ID: [name], male    DOB: 1970/04/13, 43 y.o.   MRN: [id number]  HPI  43 year old patient who has a history of morbid obesity.  He complains of severe pain and numbness involving both hands, which is worse at night.  He has been using a wrist plaque on the right which has been helpful, but he does not use this consistently.  He has received Depo-Medrol injections, which have been quite helpful.  He is on chronic narcotics for chronic back pain.  He is requesting repeat injection therapy.  Past Medical History  Diagnosis Date  . Allergy   . Asthma   . Sinusitis, chronic   . Apnea   . Restless leg   . Spinal fusion failure     spinal fluid leak  . Pneumonia     has had 6-7 times  . Hypoglycemia   . Alpha-1-antitrypsin deficiency   . Migraines   . Sleep apnea     records on chart, severe, stops breathing 100+ times per night, uses cpap setting 18, warm air machine    History   Social History  . Marital Status: Married    Spouse Name: N/A    Number of Children: N/A  . Years of Education: N/A   Occupational History  . Not on file.   Social History Main Topics  . Smoking status: Never Smoker   . Smokeless tobacco: Not on file  . Alcohol Use: No  . Drug Use: No  . Sexual Activity: Yes   Other Topics Concern  . Not on file   Social History Narrative  . No narrative on file    Past Surgical History  Procedure Laterality Date  . Cholecystectomy    . Spinal fusion      l4-5 then l3-5 revision with failure  . Lumbar fusion  L45 the L-S1 revision    spinal leak complication  . Lumbar laminectomy/decompression microdiscectomy  12/14/2011    Procedure: LUMBAR LAMINECTOMY/DECOMPRESSION MICRODISCECTOMY 1 LEVEL;  Surgeon: Reinaldo Meekerandy O Kritzer, MD;  Location: [facility];  Service: Neurosurgery;  Laterality: Left;  Left Lumbar Five-Sacral One Decompressive Foraminotomy    Family History  Problem Relation Age of Onset  . Coronary artery disease     . Hypertension Mother   . Hyperlipidemia Father   . Heart disease Father     No Known Allergies  Current Outpatient Prescriptions on File Prior to Visit  Medication Sig Dispense Refill  . clonazePAM (KLONOPIN) 1 MG tablet TAKE 1 TABLET BY MOUTH AT BEDTIME AS NEEDED SLEEP  30 tablet  3  . cyclobenzaprine (FLEXERIL) 10 MG tablet Take 1 tablet (10 mg total) by mouth 3 (three) times daily as needed for muscle spasms.  60 tablet  2  . diazepam (VALIUM) 5 MG tablet TAKE 1 TABLET BY MOUTH EVERY 6HRS AS NEEDED  30 tablet  3  . DULoxetine (CYMBALTA) 60 MG capsule Take 60 mg by mouth 2 (two) times daily.      Marland Kitchen. gabapentin (NEURONTIN) 300 MG capsule Take 2 capsules (600 mg total) by mouth 3 (three) times daily.  120 capsule  11  . HYDROmorphone (DILAUDID) 4 MG tablet Take 1 tablet (4 mg total) by mouth every 3 (three) hours as needed.  100 tablet  0  . ketoprofen (ORUDIS) 75 MG capsule Take 1 capsule (75 mg total) by mouth 3 (three) times daily as needed.  30 capsule    . Lorcaserin HCl 10  MG TABS Take 10 mg by mouth 2 (two) times daily.  60 tablet  3  . metoCLOPramide (REGLAN) 5 MG tablet Take 5 mg by mouth 4 (four) times daily as needed.      . SUMAtriptan (IMITREX) 100 MG tablet Take 100 mg by mouth every 2 (two) hours as needed.       No current facility-administered medications on file prior to visit.    BP 120/80  Pulse 70  Temp(Src) 98.3 F (36.8 C) (Oral)  Resp 20  Ht 6\' 2"  (1.88 m)  Wt 371 lb (168.284 kg)  BMI 47.61 kg/m2  SpO2 98%     Review of Systems  Musculoskeletal: Positive for back pain.  Neurological: Positive for numbness.       Objective:   Physical Exam  Constitutional: He appears well-developed and well-nourished. No distress.  Morbidly obese with weight 371.  Blood pressure 120/80  Musculoskeletal:  Positive Tinel's          Assessment & Plan:   Impression bilateral carpal tunnel syndrome.  Nerve conduction studies will be reordered  Procedure  note:  After informed consent and preparation with alcohol and Betadine swab, and local anesthesia with 1% Xylocaine without epinephrine , 20 mg of Depo-Medrol injected about the carpal space bilaterally.  A dressing applied

## 2013-06-13 NOTE — Telephone Encounter (Signed)
Letter mailed to home address per patient

## 2013-06-13 NOTE — Progress Notes (Signed)
Pre-visit discussion using our clinic review tool. No additional management support is needed unless otherwise documented below in the visit note.  

## 2013-06-19 ENCOUNTER — Telehealth: Payer: Self-pay | Admitting: Internal Medicine

## 2013-06-19 NOTE — Telephone Encounter (Signed)
Cymbalta is now generic we no longer receive samples

## 2013-06-19 NOTE — Telephone Encounter (Signed)
Pt needs samples of cymbalta 60 mg

## 2013-06-19 NOTE — Telephone Encounter (Signed)
Pt is aware.  

## 2013-07-10 ENCOUNTER — Other Ambulatory Visit: Payer: Self-pay | Admitting: Internal Medicine

## 2013-07-30 ENCOUNTER — Telehealth: Payer: Self-pay | Admitting: Internal Medicine

## 2013-07-30 DIAGNOSIS — G5603 Carpal tunnel syndrome, bilateral upper limbs: Secondary | ICD-10-CM

## 2013-07-30 NOTE — Telephone Encounter (Signed)
Pt said Dr Lovell Sheehan had written a letter about his carpal  Tunnel and he now need another letter  stating   Carpal  Tunnel in his hands and wrist  is the result of pushing and pulling him self up and down as a result of the injury on 08/31/2008 and that he never treated him for carpal  tunnel before and that he never has had carpal  tunnel before the injury. And that he also recommends going to a specialist for the carpal tunnel .  Pt also would like to ask that Dr Lovell Sheehan recommend him a pcp in the [place] office or with any other  pcp

## 2013-08-01 NOTE — Telephone Encounter (Signed)
Dr Para March or one of the docs at [place] [facility]  He will need to see a hand orthopedist.

## 2013-08-04 NOTE — Telephone Encounter (Signed)
Pt aware, referral order placed, letter retyped and printed for pt to p/u

## 2013-08-14 ENCOUNTER — Telehealth: Payer: Self-pay | Admitting: Internal Medicine

## 2013-08-14 NOTE — Telephone Encounter (Signed)
Pt is needing to speak with dr. Lovell Sheehan personally regarding his disability, pt states dr. Lovell Sheehan has been his dr since he was 43 yrs old and his disability attorney has to have a letter directly from dr. Lovell Sheehan and pt need to know how he can speak with dr. Lovell Sheehan during his transition.

## 2013-08-15 NOTE — Telephone Encounter (Signed)
Dr Lovell Sheehan to call

## 2013-09-04 ENCOUNTER — Telehealth: Payer: Self-pay | Admitting: Internal Medicine

## 2013-09-04 NOTE — Telephone Encounter (Signed)
Pt called to say that he need Dr Lovell Sheehan to write him a letter stating his condition as to why he can not work .

## 2013-10-01 ENCOUNTER — Ambulatory Visit: Payer: Self-pay | Admitting: Podiatry

## 2013-10-24 ENCOUNTER — Ambulatory Visit: Payer: Self-pay | Admitting: Podiatry

## 2013-11-05 ENCOUNTER — Ambulatory Visit: Payer: Self-pay | Admitting: Podiatry

## 2013-11-20 NOTE — Patient Instructions (Signed)

## 2013-11-21 ENCOUNTER — Encounter: Payer: Self-pay | Admitting: Podiatry

## 2013-11-21 ENCOUNTER — Ambulatory Visit: Payer: Self-pay

## 2013-11-21 NOTE — Progress Notes (Signed)
This encounter was created in error - please disregard.

## 2013-12-05 ENCOUNTER — Ambulatory Visit (INDEPENDENT_AMBULATORY_CARE_PROVIDER_SITE_OTHER): Payer: Self-pay | Admitting: Family Medicine

## 2013-12-05 ENCOUNTER — Encounter: Payer: Self-pay | Admitting: Family Medicine

## 2013-12-05 VITALS — BP 112/74 | HR 65 | Temp 98.0°F | Ht 74.0 in | Wt 374.5 lb

## 2013-12-05 DIAGNOSIS — M549 Dorsalgia, unspecified: Secondary | ICD-10-CM

## 2013-12-05 DIAGNOSIS — G8929 Other chronic pain: Secondary | ICD-10-CM

## 2013-12-05 NOTE — Patient Instructions (Signed)
I recommend you see a pain specialist for your care. I do not manage chronic pain or prescribe controlled medications for pain. I will have my assistant contact the pain clinic to see if they can see you sooner. In the interim if you have severe unbearable or worsening pain or symptoms I advise evaluation in the emergency room.  Please schedule new patient visit with a new primary care doctor: [address] [place], [place] [id number]  Phone: [phone] Fax: [phone]

## 2013-12-05 NOTE — Progress Notes (Signed)
No chief complaint on file.   HPI:  Acute visit for:  Chronic Back Pain: -prior pt of Dr. Lovell Sheehan, no PCP currently -reports hx DDD, myelopathy  -reports: reports hx of multiple surgery for this, reports [facility] told him they did not have anything further to offer and they advised he see a pain specialist and gave him a medication bridge which ran out in September - has only used OTC medications since but they do not help - he reports he has an appointment with [facility] in January, workers comp -he is requesting we try to get him in to see pain specialist sooner -symptoms: he reports chronic unchanged severe pain in low back that radiates down posterior L leg with residual weakness since prior surgeries in L great toe and foot -dilaudid helped pain minimally - took edge off the pain   ROS: See pertinent positives and negatives per HPI.  Past Medical History  Diagnosis Date  . Allergy   . Asthma   . Sinusitis, chronic   . Apnea   . Restless leg   . Spinal fusion failure     spinal fluid leak  . Pneumonia     has had 6-7 times  . Hypoglycemia   . Alpha-1-antitrypsin deficiency   . Migraines   . Sleep apnea     records on chart, severe, stops breathing 100+ times per night, uses cpap setting 18, warm air machine    Past Surgical History  Procedure Laterality Date  . Cholecystectomy    . Spinal fusion      l4-5 then l3-5 revision with failure  . Lumbar fusion  L45 the L-S1 revision    spinal leak complication  . Lumbar laminectomy/decompression microdiscectomy  12/14/2011    Procedure: LUMBAR LAMINECTOMY/DECOMPRESSION MICRODISCECTOMY 1 LEVEL;  Surgeon: Reinaldo Meeker, MD;  Location: [facility];  Service: Neurosurgery;  Laterality: Left;  Left Lumbar Five-Sacral One Decompressive Foraminotomy    Family History  Problem Relation Age of Onset  . Coronary artery disease    . Hypertension Mother   . Hyperlipidemia Father   . Heart disease Father      History   Social History  . Marital Status: Married    Spouse Name: N/A    Number of Children: N/A  . Years of Education: N/A   Social History Main Topics  . Smoking status: Never Smoker   . Smokeless tobacco: None  . Alcohol Use: No  . Drug Use: No  . Sexual Activity: Yes   Other Topics Concern  . None   Social History Narrative  . None    Current outpatient prescriptions:albuterol (PROVENTIL HFA;VENTOLIN HFA) 108 (90 BASE) MCG/ACT inhaler, Inhale 2 puffs into the lungs every 6 (six) hours as needed for wheezing or shortness of breath., Disp: 1 Inhaler, Rfl: 5;  clonazePAM (KLONOPIN) 1 MG tablet, TAKE 1 TABLET BY MOUTH AT BEDTIME AS NEEDED SLEEP, Disp: 30 tablet, Rfl: 5 cyclobenzaprine (FLEXERIL) 10 MG tablet, Take 1 tablet (10 mg total) by mouth 3 (three) times daily as needed for muscle spasms., Disp: 60 tablet, Rfl: 2;  diazepam (VALIUM) 5 MG tablet, TAKE 1 TABLET BY MOUTH EVERY 6HRS AS NEEDED, Disp: 30 tablet, Rfl: 3;  DULoxetine (CYMBALTA) 60 MG capsule, Take 60 mg by mouth 2 (two) times daily., Disp: , Rfl:  fluticasone (FLOVENT HFA) 110 MCG/ACT inhaler, Inhale 1 puff into the lungs 2 (two) times daily., Disp: 1 Inhaler, Rfl: 5;  gabapentin (NEURONTIN) 300 MG capsule, Take  2 capsules (600 mg total) by mouth 3 (three) times daily., Disp: 120 capsule, Rfl: 11;  HYDROmorphone (DILAUDID) 4 MG tablet, Take 1 tablet (4 mg total) by mouth every 3 (three) hours as needed., Disp: 100 tablet, Rfl: 0 ketoprofen (ORUDIS) 75 MG capsule, Take 1 capsule (75 mg total) by mouth 3 (three) times daily as needed., Disp: 30 capsule, Rfl: ;  Lorcaserin HCl 10 MG TABS, Take 10 mg by mouth 2 (two) times daily., Disp: 60 tablet, Rfl: 3;  metoCLOPramide (REGLAN) 5 MG tablet, Take 5 mg by mouth 4 (four) times daily as needed., Disp: , Rfl: ;  Pregabalin (LYRICA PO), Take by mouth., Disp: , Rfl:  salmeterol (SEREVENT) 50 MCG/DOSE diskus inhaler, Inhale 1 puff into the lungs 2 (two) times daily., Disp: 1  Inhaler, Rfl: 5;  SUMAtriptan (IMITREX) 100 MG tablet, Take 100 mg by mouth every 2 (two) hours as needed., Disp: , Rfl: ;  Topiramate (TOPAMAX PO), Take by mouth., Disp: , Rfl:   EXAM:  Filed Vitals:   12/05/13 1137  BP: 112/74  Pulse: 65  Temp: 98 F (36.7 C)    Body mass index is 48.06 kg/(m^2).  GENERAL: vitals reviewed and listed above, alert, oriented, appears well hydrated and in no acute distress  HEENT: atraumatic, conjunttiva clear, no obvious abnormalities on inspection of external nose and ears  NECK: no obvious masses on inspection  LUNGS: clear to auscultation bilaterally, no wheezes, rales or rhonchi, good air movement  CV: HRRR, no peripheral edema  MS: moves all extremities without noticeable abnormality Antalgic but steady gait TTP bilat lumbar paraspinal muscles Normal strength and sensation in LE bilat except for decreased strength in L foot and toe ext  PSYCH: pleasant and cooperative, no obvious depression or anxiety  ASSESSMENT AND PLAN:  Discussed the following assessment and plan:  Chronic back pain  -advised I wish I could help him but I do not manage chronic pain and given severity of symptoms I advised he keep appointment with his pain specialist for pain management and any disability related matters -advised there may be other treatments that are non-surgical that a pain specialist may also be able to offer -will have assistant check to see if they can get him in sooner but advised can not make any promises and advised he check back for wait list and advised emergency precautions (advised Marchelle Folksmanda to check on this with Eunice Blaseebbie) -offered primary care here with me or doctor hunter for non-pain related issues but he wants to go to [place] as this is where he lives - number provided to call -Patient advised to return or notify a doctor immediately if symptoms worsen or persist or new concerns arise.  Patient Instructions  I recommend you see a  pain specialist for your care. I do not manage chronic pain or prescribe controlled medications for pain. I will have my assistant contact the pain clinic to see if they can see you sooner. In the interim if you have severe unbearable or worsening pain or symptoms I advise evaluation in the emergency room.  Please schedule new patient visit with a new primary care doctor: [address] [place], [place] [id number]  Phone: [phone] Fax: [phone]       Kriste BasqueKIM, HANNAH R.

## 2013-12-05 NOTE — Progress Notes (Signed)
Pre visit review using our clinic review tool, if applicable. No additional management support is needed unless otherwise documented below in the visit note. 

## 2013-12-23 ENCOUNTER — Encounter: Payer: Self-pay | Admitting: Physical Medicine & Rehabilitation

## 2013-12-25 ENCOUNTER — Encounter: Payer: Worker's Compensation | Attending: Physical Medicine & Rehabilitation

## 2013-12-25 ENCOUNTER — Ambulatory Visit (HOSPITAL_BASED_OUTPATIENT_CLINIC_OR_DEPARTMENT_OTHER): Payer: Worker's Compensation | Admitting: Physical Medicine & Rehabilitation

## 2013-12-25 ENCOUNTER — Encounter: Payer: Self-pay | Admitting: Physical Medicine & Rehabilitation

## 2013-12-25 ENCOUNTER — Other Ambulatory Visit: Payer: Self-pay | Admitting: Physical Medicine & Rehabilitation

## 2013-12-25 VITALS — BP 131/74 | HR 81 | Resp 14 | Ht 74.0 in | Wt 382.0 lb

## 2013-12-25 DIAGNOSIS — M545 Low back pain: Secondary | ICD-10-CM | POA: Insufficient documentation

## 2013-12-25 DIAGNOSIS — Y839 Surgical procedure, unspecified as the cause of abnormal reaction of the patient, or of later complication, without mention of misadventure at the time of the procedure: Secondary | ICD-10-CM | POA: Insufficient documentation

## 2013-12-25 DIAGNOSIS — G8918 Other acute postprocedural pain: Secondary | ICD-10-CM | POA: Diagnosis not present

## 2013-12-25 DIAGNOSIS — M79605 Pain in left leg: Secondary | ICD-10-CM | POA: Insufficient documentation

## 2013-12-25 DIAGNOSIS — M961 Postlaminectomy syndrome, not elsewhere classified: Secondary | ICD-10-CM | POA: Diagnosis not present

## 2013-12-25 DIAGNOSIS — G894 Chronic pain syndrome: Secondary | ICD-10-CM

## 2013-12-25 DIAGNOSIS — M5410 Radiculopathy, site unspecified: Secondary | ICD-10-CM | POA: Diagnosis not present

## 2013-12-25 DIAGNOSIS — Z79899 Other long term (current) drug therapy: Secondary | ICD-10-CM

## 2013-12-25 DIAGNOSIS — Z5181 Encounter for therapeutic drug level monitoring: Secondary | ICD-10-CM

## 2013-12-25 DIAGNOSIS — M5416 Radiculopathy, lumbar region: Secondary | ICD-10-CM

## 2013-12-25 DIAGNOSIS — G8929 Other chronic pain: Secondary | ICD-10-CM

## 2013-12-25 MED ORDER — GABAPENTIN 300 MG PO CAPS: 600.0000 mg | ORAL_CAPSULE | Freq: Three times a day (TID) | ORAL | Status: DC

## 2013-12-25 NOTE — Progress Notes (Signed)
Subjective:    Patient ID: [name], male    DOB: 12-08-1970, 43 y.o.   MRN: [id number] Date of injury 08/31/2008 Lifting injury at work, Initially treated as lumbar sprain, seen at urgent care trial of medication management including hydrocodone Medrol Dosepak. Developed more radicular symptoms MRI from 2010 demonstrating loss of disc height L3-4 L4-5 no central canal stenosis had mild impingement left L5. HPI Patient underwent conservative care had physical therapy which increased his pain, also with multiple epidural injections which were not helpful. Underwent lumbar surgery. Head lumbar MRI as well as myelogram in 2010, results reviewed [facility] medical device prescribed Left L4-L5 transforaminal interbody fusion performed 04/08/2009 started on Dilaudid postoperatively 06/09/2009, postoperative PT in 20 11 repeat MRI 2011 ordered by Dr. Brien Few showed postsurgical changes L4-L5 persistent postoperative pain. Repeat myelogram showed intact fusion but left-sided disc protrusion L5-S1 underwent epidural injections with Dr. Brien Few which were not helpful. Also see met [facility] migraine headaches as well as depression still sees them for ongoing issues. MRI of the brain was normal Underwent left L5-S1 transforaminal interbody fusion right L5-S1 pedicle screw performed 03/04/2010 Dr. Hal Neer In pain rehabilitation stay postoperatively, Dr. Eda Keys attending Continued postoperative pain. Neurosurgical second opinion Dr. Vertell Limber 01/03/2011  Reviewed Epic chart, E chart as well as greater than 1 inch stack of paper medical records from [facility] Pain Inventory Average Pain 10 Pain Right Now 10 My pain is sharp, burning, stabbing, tingling and aching  In the last 24 hours, has pain interfered with the following? General activity 2 Relation with others 8 Enjoyment of life 2 What TIME of day is your pain at its worst? morning,daytime, evening. night Sleep (in  general) Poor  Pain is worse with: walking, bending, sitting, standing and some activites Pain improves with: rest, heat/ice and medication Relief from Meds: 7  Mobility walk without assistance walk with assistance use a cane how many minutes can you walk? 8-10  Function disabled: date disabled 09/2008 I need assistance with the following:  dressing, bathing, meal prep, household duties and shopping  Neuro/Psych weakness numbness tremor tingling trouble walking spasms dizziness depression loss of taste or smell suicidal thoughts  Prior Studies Any changes since last visit?  no  Physicians involved in your care Any changes since last visit?  no   Family History  Problem Relation Age of Onset  . Coronary artery disease    . Hypertension Mother   . Hyperlipidemia Father   . Heart disease Father    History   Social History  . Marital Status: Married    Spouse Name: N/A    Number of Children: N/A  . Years of Education: N/A   Social History Main Topics  . Smoking status: Never Smoker   . Smokeless tobacco: None  . Alcohol Use: No  . Drug Use: No  . Sexual Activity: Yes   Other Topics Concern  . None   Social History Narrative   Past Surgical History  Procedure Laterality Date  . Cholecystectomy    . Spinal fusion      l4-5 then l3-5 revision with failure  . Lumbar fusion  L45 the L-S1 revision    spinal leak complication  . Lumbar laminectomy/decompression microdiscectomy  12/14/2011    Procedure: LUMBAR LAMINECTOMY/DECOMPRESSION MICRODISCECTOMY 1 LEVEL;  Surgeon: Faythe Ghee, MD;  Location: [facility];  Service: Neurosurgery;  Laterality: Left;  Left Lumbar Five-Sacral One Decompressive Foraminotomy   Past Medical History  Diagnosis Date  . Allergy   . Asthma   . Sinusitis, chronic   . Apnea   . Restless leg   . Spinal fusion failure     spinal fluid leak  . Pneumonia     has had 6-7 times  . Hypoglycemia   . Alpha-1-antitrypsin  deficiency   . Migraines   . Sleep apnea     records on chart, severe, stops breathing 100+ times per night, uses cpap setting 18, warm air machine   BP 131/74 mmHg  Pulse 81  Resp 14  Ht 6' 2"  (1.88 m)  Wt 382 lb (173.274 kg)  BMI 49.03 kg/m2  SpO2 95%  Opioid Risk Score: 3 Fall Risk Score: Moderate Fall Risk (6-13 points) (pt given fall prevention paperwork)   Review of Systems  Neurological: Positive for dizziness, tremors, weakness and numbness.       Tingling Spasms   Psychiatric/Behavioral: Positive for suicidal ideas and dysphoric mood.  All other systems reviewed and are negative.      Objective:   Physical Exam  Constitutional: He is oriented to person, place, and time. He appears well-developed.  obese  HENT:  Head: Normocephalic and atraumatic.  Eyes: Conjunctivae and EOM are normal. Pupils are equal, round, and reactive to light.  Neck: Normal range of motion.  Musculoskeletal:  0-25% flexion extension lateral bending rotation lumbar spine area  No tenderness palpation lumbar paraspinals or over the hips.  Scarring lumbar area  Neurological: He is alert and oriented to person, place, and time. He displays no atrophy and no tremor. He exhibits normal muscle tone.  Reflex Scores:      Patellar reflexes are 2+ on the right side and 2+ on the left side.      Achilles reflexes are 2+ on the right side and 2+ on the left side. Motor strength 5/5 bilateral deltoids, biceps, triceps, grip, right hip flexor knee extensor and ankle dorsiflexor Left hip flexor and knee extensor 4, ankle dorsiflexor 3/5 flexor extensor 2 minus  Diminished left L4 L5 S1 dermatomal sensation to pinprick left side  Psychiatric: He exhibits a depressed mood.  Nursing note and vitals reviewed.         Assessment & Plan:  1. 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 If patient is already using gabapentin will not need Lyrica  I discussed I would not resume the Dilaudid but instead start MS Contin 15 mg twice a day, may need to titrate up from there, this is assuming urine drug screen looks okay  Opioid risk is 3  Neurologist is prescribing Imitrex and Topamax for headaches as well as Reglan.  Recommend walking 10 minutes 3 times per day Also [facility] Even though patient does not have fibromyalgia the fibromyalgia program may be helpful.  We'll check urine drug screen today if this looks appropriate then would recommend trial of MS Contin starting at 15 mg twice a day  We discussed spinal cord stimulation as potential treatment option for his left lower extremity radicular pain. We did discuss it is unlikely to help much with his low back pain. He would consider this. I do not perform the procedure. He would need to go somebody else do this. [facility] helpful in this regard.

## 2013-12-25 NOTE — Patient Instructions (Signed)
Patient would benefit from Fibromyalgia aquatic program at the [facility].

## 2013-12-26 LAB — PMP ALCOHOL METABOLITE (ETG)

## 2013-12-29 LAB — BENZODIAZEPINES (GC/LC/MS), URINE
Alprazolam metabolite (GC/LC/MS), ur confirm: NEGATIVE ng/mL (ref <25)
Clonazepam metabolite (GC/LC/MS), ur confirm: 200 ng/mL — AB (ref <25)
Flurazepam metabolite (GC/LC/MS), ur confirm: NEGATIVE ng/mL (ref <50)
Lorazepam (GC/LC/MS), ur confirm: NEGATIVE ng/mL (ref <50)
MIDAZOLAMU: NEGATIVE ng/mL (ref <50)
NORDIAZEPAMU: NEGATIVE ng/mL (ref <50)
Oxazepam (GC/LC/MS), ur confirm: 556 ng/mL — AB (ref <50)
TRIAZOLAMU: NEGATIVE ng/mL (ref <50)
Temazepam (GC/LC/MS), ur confirm: 135 ng/mL — AB (ref <50)

## 2013-12-29 LAB — ETHYL GLUCURONIDE, URINE
ETGU: 528 ng/mL — AB (ref <500)
ETHYL SULFATE (ETS): 146 ng/mL — AB (ref <100)

## 2013-12-30 LAB — PRESCRIPTION MONITORING PROFILE (SOLSTAS)
Amphetamine/Meth: NEGATIVE ng/mL
BARBITURATE SCREEN, URINE: NEGATIVE ng/mL
BUPRENORPHINE, URINE: NEGATIVE ng/mL
CANNABINOID SCRN UR: NEGATIVE ng/mL
CREATININE, URINE: 199.48 mg/dL (ref >20.0)
Carisoprodol, Urine: NEGATIVE ng/mL
Cocaine Metabolites: NEGATIVE ng/mL
FENTANYL URINE: NEGATIVE ng/mL
MDMA URINE: NEGATIVE ng/mL
METHADONE SCREEN, URINE: NEGATIVE ng/mL
Meperidine, Ur: NEGATIVE ng/mL
Nitrites, Initial: NEGATIVE ug/mL
OPIATE SCREEN, URINE: NEGATIVE ng/mL
Oxycodone Screen, Ur: NEGATIVE ng/mL
Propoxyphene: NEGATIVE ng/mL
TAPENTADOLUR: NEGATIVE ng/mL
Tramadol Scrn, Ur: NEGATIVE ng/mL
ZOLPIDEM, URINE: NEGATIVE ng/mL
pH, Initial: 7.5 pH (ref 4.5–8.9)

## 2014-01-07 ENCOUNTER — Encounter: Payer: Self-pay | Admitting: Registered Nurse

## 2014-01-07 ENCOUNTER — Encounter: Payer: Worker's Compensation | Attending: Registered Nurse | Admitting: Registered Nurse

## 2014-01-07 ENCOUNTER — Telehealth: Payer: Self-pay | Admitting: *Deleted

## 2014-01-07 VITALS — BP 132/66 | HR 80 | Resp 14 | Ht 74.0 in | Wt 390.0 lb

## 2014-01-07 DIAGNOSIS — G8918 Other acute postprocedural pain: Secondary | ICD-10-CM | POA: Insufficient documentation

## 2014-01-07 DIAGNOSIS — M5416 Radiculopathy, lumbar region: Secondary | ICD-10-CM

## 2014-01-07 DIAGNOSIS — Z79899 Other long term (current) drug therapy: Secondary | ICD-10-CM

## 2014-01-07 DIAGNOSIS — G8929 Other chronic pain: Secondary | ICD-10-CM

## 2014-01-07 DIAGNOSIS — M961 Postlaminectomy syndrome, not elsewhere classified: Secondary | ICD-10-CM | POA: Insufficient documentation

## 2014-01-07 DIAGNOSIS — G894 Chronic pain syndrome: Secondary | ICD-10-CM

## 2014-01-07 DIAGNOSIS — M545 Low back pain: Secondary | ICD-10-CM | POA: Insufficient documentation

## 2014-01-07 DIAGNOSIS — M79605 Pain in left leg: Secondary | ICD-10-CM | POA: Insufficient documentation

## 2014-01-07 DIAGNOSIS — Z5181 Encounter for therapeutic drug level monitoring: Secondary | ICD-10-CM

## 2014-01-07 DIAGNOSIS — M5410 Radiculopathy, site unspecified: Secondary | ICD-10-CM | POA: Diagnosis not present

## 2014-01-07 MED ORDER — MORPHINE SULFATE ER 15 MG PO TBCR: 15.0000 mg | EXTENDED_RELEASE_TABLET | Freq: Two times a day (BID) | ORAL | Status: DC

## 2014-01-07 NOTE — Progress Notes (Signed)
Subjective:    Patient ID: [name], male    DOB: 07-27-1970, 43 y.o.   MRN: [id number]  HPI: [name] is a 43 year old male who returns for follow up for chronic pain and medication refill. He says his pain is located in his mid-lower back radiating down his left leg. He rates his pain 10. He admit to being depressed, denies any sucidal ideation or plan. Spoke with Dr. Wynn BankerKirsteins regarding the +ETOH in his UDS. We will prescribe analgesics also the Narcotic contract was reviewed and he is aware of No ETOH while receiving analgesics. Also if he is positive for ETOH on any UDS he will not be prescribed analgesics he verbalizes understanding. He will be called in three weeks for a pill count and a UDS.   Pain Inventory Average Pain 10 Pain Right Now 10 My pain is constant, sharp, burning, dull, stabbing, tingling and aching  In the last 24 hours, has pain interfered with the following? General activity 10 Relation with others 10 Enjoyment of life 10 What TIME of day is your pain at its worst? morning, daytime, evening, night Sleep (in general) Poor  Pain is worse with: walking, bending, sitting, inactivity and standing Pain improves with: heat/ice and medication Relief from Meds: 5  Mobility walk with assistance how many minutes can you walk? 5-10 ability to climb steps?  no do you drive?  yes  Function disabled: date disabled . I need assistance with the following:  feeding, dressing, bathing, toileting, meal prep, household duties and shopping  Neuro/Psych weakness numbness tremor tingling trouble walking spasms dizziness depression loss of taste or smell suicidal thoughts, patient has no active plan    Prior Studies Any changes since last visit?  no  Physicians involved in your care Any changes since last visit?  no   Family History  Problem Relation Age of Onset  . Coronary artery disease    . Hypertension Mother   . Hyperlipidemia  Father   . Heart disease Father    History   Social History  . Marital Status: Married    Spouse Name: N/A    Number of Children: N/A  . Years of Education: N/A   Social History Main Topics  . Smoking status: Never Smoker   . Smokeless tobacco: None  . Alcohol Use: No  . Drug Use: No  . Sexual Activity: Yes   Other Topics Concern  . None   Social History Narrative   Past Surgical History  Procedure Laterality Date  . Cholecystectomy    . Spinal fusion      l4-5 then l3-5 revision with failure  . Lumbar fusion  L45 the L-S1 revision    spinal leak complication  . Lumbar laminectomy/decompression microdiscectomy  12/14/2011    Procedure: LUMBAR LAMINECTOMY/DECOMPRESSION MICRODISCECTOMY 1 LEVEL;  Surgeon: Reinaldo Meekerandy O Kritzer, MD;  Location: [facility];  Service: Neurosurgery;  Laterality: Left;  Left Lumbar Five-Sacral One Decompressive Foraminotomy   Past Medical History  Diagnosis Date  . Allergy   . Asthma   . Sinusitis, chronic   . Apnea   . Restless leg   . Spinal fusion failure     spinal fluid leak  . Pneumonia     has had 6-7 times  . Hypoglycemia   . Alpha-1-antitrypsin deficiency   . Migraines   . Sleep apnea     records on chart, severe, stops breathing 100+ times per night, uses cpap setting 18,  warm air machine   BP 132/66 mmHg  Pulse 80  Resp 14  Ht 6\' 2"  (1.88 m)  Wt 390 lb (176.903 kg)  BMI 50.05 kg/m2  SpO2 95%  Opioid Risk Score:   Fall Risk Score: Low Fall Risk (0-5 points) (pt. given pamphlet) Review of Systems  Constitutional:       Weight gain Trouble walking  Respiratory: Positive for apnea, cough and shortness of breath.   Endocrine:       Low blood sugar  Neurological: Positive for dizziness, tremors, weakness and numbness.       Spasms Tingling   Psychiatric/Behavioral: Positive for suicidal ideas and dysphoric mood.  All other systems reviewed and are negative.      Objective:   Physical Exam  Constitutional: He is  oriented to person, place, and time. He appears well-developed and well-nourished.  HENT:  Head: Normocephalic and atraumatic.  Neck: Normal range of motion. Neck supple.  Cardiovascular: Normal rate and regular rhythm.   Pulmonary/Chest: Effort normal and breath sounds normal.  Musculoskeletal:  Normal Muscle Bulk and Muscle Testing Reveals: Upper Extremities: Full ROM and Muscle Strength 5/5 Thoracic and Lumbar Hypersensitivity Lower Extremities: Right:Full ROM and Muscle Strength 5/5. Right Leg Flexion Produces Pain into Lumbar.  Left Decreased ROM and Muscle strength 4/5 Left Leg Flexion Produces Pain into lower Extremity Arises from chair with slight difficulty Wide Based Antalgic gait     Neurological: He is alert and oriented to person, place, and time.  Skin: Skin is warm and dry.  Psychiatric: He has a normal mood and affect.  Nursing note and vitals reviewed.         Assessment & Plan:  1. 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 All questions were encouraged and answered.  F/U in 3 weeks for a pill count and UDS

## 2014-01-07 NOTE — Telephone Encounter (Signed)
Please call Mr Mclees for 3 weeks from today for a pill count and uds  (ask Riley Lam about this--I am not sure if it to be prior to the appt scheduled as unannounced prior to the appt with her on 02/04/14)

## 2014-01-14 NOTE — Progress Notes (Signed)
Urine drug screen for this encounter is positive for ETOH as well as prescribed clonazepam and metabolite of diazepam.

## 2014-01-16 ENCOUNTER — Ambulatory Visit: Payer: Worker's Compensation | Admitting: Physical Medicine & Rehabilitation

## 2014-02-02 ENCOUNTER — Encounter: Payer: Self-pay | Admitting: Registered Nurse

## 2014-02-02 ENCOUNTER — Other Ambulatory Visit: Payer: Self-pay | Admitting: Physical Medicine & Rehabilitation

## 2014-02-02 ENCOUNTER — Telehealth: Payer: Self-pay | Admitting: *Deleted

## 2014-02-02 ENCOUNTER — Encounter (HOSPITAL_BASED_OUTPATIENT_CLINIC_OR_DEPARTMENT_OTHER): Payer: Worker's Compensation | Admitting: Registered Nurse

## 2014-02-02 ENCOUNTER — Ambulatory Visit (HOSPITAL_COMMUNITY)
Admission: RE | Admit: 2014-02-02 | Discharge: 2014-02-02 | Disposition: A | Discharge status: Home / Self Care | Payer: Worker's Compensation | Source: Ambulatory Visit | Attending: Registered Nurse | Admitting: Registered Nurse

## 2014-02-02 VITALS — BP 136/78 | HR 68 | Resp 14

## 2014-02-02 DIAGNOSIS — M961 Postlaminectomy syndrome, not elsewhere classified: Secondary | ICD-10-CM

## 2014-02-02 DIAGNOSIS — Z79899 Other long term (current) drug therapy: Secondary | ICD-10-CM

## 2014-02-02 DIAGNOSIS — K5909 Other constipation: Secondary | ICD-10-CM

## 2014-02-02 DIAGNOSIS — R109 Unspecified abdominal pain: Secondary | ICD-10-CM | POA: Insufficient documentation

## 2014-02-02 DIAGNOSIS — G894 Chronic pain syndrome: Secondary | ICD-10-CM

## 2014-02-02 DIAGNOSIS — Z5181 Encounter for therapeutic drug level monitoring: Secondary | ICD-10-CM

## 2014-02-02 DIAGNOSIS — M5416 Radiculopathy, lumbar region: Secondary | ICD-10-CM

## 2014-02-02 DIAGNOSIS — R1010 Upper abdominal pain, unspecified: Secondary | ICD-10-CM

## 2014-02-02 NOTE — Telephone Encounter (Signed)
I spoke to [name] regarding his Abdominal X-ray No Obstruction or Ileus.  He has been instructed to purchased Miralax and to take medication 3- 4 times a day until he has a bowel movement after this has occurred to decreased medication to BID. He verbalizes understanding.  He understands were awaiting UDS results before prescribing new narcotic. He verbalizes understanding.  Once I have all information I will speak with Dr. Riley KillSwartz. He verbalizes understanding.  Also instructed if he has no relief from constipation to follow up with his PCP and he verbalizes understanding.

## 2014-02-02 NOTE — Telephone Encounter (Signed)
Pt called to inform that he completed his x-rays over at [facility] today and that they should be available for viewing this afternoon

## 2014-02-02 NOTE — Telephone Encounter (Signed)
Pt states that he thinks he is having severe side effects to his morphine medication,  Asking for a call back ASAP

## 2014-02-02 NOTE — Progress Notes (Signed)
Subjective:    Patient ID: [name], male    DOB: Feb 03, 1971, 43 y.o.   MRN: [id number]  HPI: [name] is a 43 year old male who returns for follow up for chronic pain and medication refill. He's complaining of abdominal pain, nausea from constipation and lower back which radiates into lower extremities anterioraly.He rates his pain 8. He called the office on 02/02/14 9:15 complaining of adverse reaction to morphine and sever constipation. He was scheduled for an appointment. He sates he has been having constipation for two weeks, he states he call the on call service on 01/30/14 he was instructed to buy MOM and fleets enema he had minimal relief noted. Also stated since he has been prescribed Morphine on 01/08/14 he was having lightheadedness and SOB after the initial dose he didn't call the office till the 4 th week. He states he thought it would pass after he adjusted to medication. His last dose of MSO4 was 01/31/2014 he sates. His Morphine has been discontinued. We ordered a UDS today, he has been instructed we won't prescribed new analgesic until  results are in. He verbalizes understanding.   An Abdominal X-ray was ordered today.  Pain Inventory Average Pain 8 Pain Right Now 8 My pain is constant, sharp, burning, dull, stabbing, tingling and aching  In the last 24 hours, has pain interfered with the following? General activity 9 Relation with others 7 Enjoyment of life 9 What TIME of day is your pain at its worst? ALL Sleep (in general) Fair  Pain is worse with: walking, bending, sitting, standing and some activites Pain improves with: rest and medication Relief from Meds: 4  Mobility walk without assistance  Function Disabled 2010  Neuro/Psych weakness numbness tingling trouble walking spasms dizziness confusion depression  Prior Studies Any changes since last visit?  no  Physicians involved in your care Any changes since last visit?   no   Family History  Problem Relation Age of Onset  . Coronary artery disease    . Hypertension Mother   . Hyperlipidemia Father   . Heart disease Father    History   Social History  . Marital Status: Married    Spouse Name: N/A    Number of Children: N/A  . Years of Education: N/A   Social History Main Topics  . Smoking status: Never Smoker   . Smokeless tobacco: None  . Alcohol Use: No  . Drug Use: No  . Sexual Activity: Yes   Other Topics Concern  . None   Social History Narrative   Past Surgical History  Procedure Laterality Date  . Cholecystectomy    . Spinal fusion      l4-5 then l3-5 revision with failure  . Lumbar fusion  L45 the L-S1 revision    spinal leak complication  . Lumbar laminectomy/decompression microdiscectomy  12/14/2011    Procedure: LUMBAR LAMINECTOMY/DECOMPRESSION MICRODISCECTOMY 1 LEVEL;  Surgeon: Reinaldo Meeker, MD;  Location: [facility];  Service: Neurosurgery;  Laterality: Left;  Left Lumbar Five-Sacral One Decompressive Foraminotomy   Past Medical History  Diagnosis Date  . Allergy   . Asthma   . Sinusitis, chronic   . Apnea   . Restless leg   . Spinal fusion failure     spinal fluid leak  . Pneumonia     has had 6-7 times  . Hypoglycemia   . Alpha-1-antitrypsin deficiency   . Migraines   . Sleep apnea  records on chart, severe, stops breathing 100+ times per night, uses cpap setting 18, warm air machine   BP 136/78 mmHg  Pulse 68  Resp 14  SpO2 99%  Opioid Risk Score:   Fall Risk Score: Low Fall Risk (0-5 points)  Review of Systems  HENT: Negative.   Eyes: Negative.   Respiratory: Negative.   Cardiovascular: Negative.   Gastrointestinal: Positive for constipation.  Endocrine: Negative.   Genitourinary: Negative.   Musculoskeletal: Positive for myalgias, back pain and arthralgias.  Skin: Negative.   Allergic/Immunologic: Negative.   Neurological: Positive for dizziness, weakness and numbness.        Tingling, trouble walking  Hematological: Negative.   Psychiatric/Behavioral: Positive for dysphoric mood.       Objective:   Physical Exam  Constitutional: He is oriented to person, place, and time. He appears well-developed and well-nourished.  HENT:  Head: Normocephalic and atraumatic.  Neck: Normal range of motion. Neck supple.  Cardiovascular: Normal rate and regular rhythm.   Pulmonary/Chest: Effort normal and breath sounds normal.  Abdominal:  Abdomen Soft and Distended BS sluggish  Musculoskeletal:  Normal Muscle Bulk and Muscle Testing Reveals: Upper extremities: Full ROM and Muscle strength 5/5 Lumbar Paraspinal Tenderness: L-3- L-5 Lower Extremities: Full ROM and Muscle strength 5/5 Arises from chair with ease Narrow Based Gait   Neurological: He is alert and oriented to person, place, and time.  Skin: Skin is warm and dry.  Psychiatric: He has a normal mood and affect.  Nursing note and vitals reviewed.         Assessment & Plan:  1. Lumbar postlaminectomy syndrome with chronic postoperative pain as well as left lower extremity radicular pain: Morphine Discontinued/ UDS 2. Constipation: RX: Abdominal X-ray  30 minutes of face to face patient care time was spent during this visit. All questions were encouraged and answered. F/U in 1 month

## 2014-02-02 NOTE — Telephone Encounter (Signed)
I spoke with [name] and he is having sever constipation with the morphine as well as dizziness after taking the morphine.  He is not going to continue taking this mediation.  He has an appt with Riley Lam on Wednesday and I will move that appt to today at 1:30 so that he can be assessed.

## 2014-02-03 ENCOUNTER — Telehealth: Payer: Self-pay | Admitting: Internal Medicine

## 2014-02-03 ENCOUNTER — Telehealth: Payer: Self-pay

## 2014-02-03 LAB — PMP ALCOHOL METABOLITE (ETG): ETGU: NEGATIVE ng/mL

## 2014-02-03 NOTE — Telephone Encounter (Signed)
I am part time now so I am not accepting more new pt than I have availability for.  If he does not waiting for new pt slot, then I would be happy to see him.  Otherwise, I would suggest he see one of my partners.

## 2014-02-03 NOTE — Telephone Encounter (Signed)
Thank you, I did not realize you were part time.  I am going to try someone else, but I appreciate your consideration. Happy New Year!

## 2014-02-03 NOTE — Telephone Encounter (Signed)
rx was denied, pt has not been seen since 2014 by Dr Lovell Sheehan.  Pharmacy notified of denial.  Please call and schedule appt for pt to establish with a new provider

## 2014-02-03 NOTE — Telephone Encounter (Signed)
Refill request for Clonazepam 1mg  [facility] [address] in [place]

## 2014-02-03 NOTE — Telephone Encounter (Signed)
Pt would like to know if you will accept him as a pt. Pt was a [facility] pt and only came to [place] to see him.  Pt lives near your office and pt's wife sees regina baity.  However, pt prefers a dr.  Stann MainlandWill you accept?

## 2014-02-03 NOTE — Telephone Encounter (Signed)
Pt would like to know if you will accept him as a pt. Pt was a [name] pt and only came to [name] to see him.  Pt lives near your office and pt's wife sees [name].  However, pt prefers a dr.  °Will you accept? °

## 2014-02-03 NOTE — Telephone Encounter (Signed)
message sent to [facility] at [facility] to est there.  Pt only came here to see dr Lovell Sheehanjenkins and lives  there. Message sent and will fu w/ pt.

## 2014-02-04 ENCOUNTER — Encounter: Payer: Worker's Compensation | Admitting: Registered Nurse

## 2014-02-04 NOTE — Telephone Encounter (Signed)
Thank you, and not that I know of.  Dr Lovell Sheehan is no longer practicing medicine. Thanks again!!

## 2014-02-04 NOTE — Telephone Encounter (Signed)
I am happy to see him as long as Dr. Lovell Sheehan does not have any concerns about him in any way.

## 2014-02-05 LAB — BENZODIAZEPINES (GC/LC/MS), URINE
ALPRAZOLAMU: NEGATIVE ng/mL (ref <25)
CLONAZEPAU: NEGATIVE ng/mL (ref <25)
Flurazepam metabolite (GC/LC/MS), ur confirm: NEGATIVE ng/mL (ref <50)
Lorazepam (GC/LC/MS), ur confirm: NEGATIVE ng/mL (ref <50)
Midazolam (GC/LC/MS), ur confirm: NEGATIVE ng/mL (ref <50)
Nordiazepam (GC/LC/MS), ur confirm: NEGATIVE ng/mL (ref <50)
Oxazepam (GC/LC/MS), ur confirm: 169 ng/mL — AB (ref <50)
Temazepam (GC/LC/MS), ur confirm: NEGATIVE ng/mL (ref <50)
Triazolam metabolite (GC/LC/MS), ur confirm: NEGATIVE ng/mL (ref <50)

## 2014-02-09 NOTE — Telephone Encounter (Signed)
Agree, this a a well known SE from opioids

## 2014-02-10 ENCOUNTER — Telehealth: Payer: Self-pay | Admitting: *Deleted

## 2014-02-10 LAB — PRESCRIPTION MONITORING PROFILE (SOLSTAS)
Amphetamine/Meth: NEGATIVE ng/mL
BUPRENORPHINE, URINE: NEGATIVE ng/mL
Barbiturate Screen, Urine: NEGATIVE ng/mL
COCAINE METABOLITES: NEGATIVE ng/mL
Cannabinoid Scrn, Ur: NEGATIVE ng/mL
Carisoprodol, Urine: NEGATIVE ng/mL
Creatinine, Urine: 277.84 mg/dL (ref >20.0)
FENTANYL URINE: NEGATIVE ng/mL
MDMA URINE: NEGATIVE ng/mL
Meperidine, Ur: NEGATIVE ng/mL
Methadone Screen, Urine: NEGATIVE ng/mL
NITRITES URINE, INITIAL: NEGATIVE ug/mL
OPIATE SCREEN, URINE: NEGATIVE ng/mL
Oxycodone Screen, Ur: NEGATIVE ng/mL
PH URINE, INITIAL: 6.7 pH (ref 4.5–8.9)
Propoxyphene: NEGATIVE ng/mL
TRAMADOL UR: NEGATIVE ng/mL
Tapentadol, urine: NEGATIVE ng/mL
Zolpidem, Urine: NEGATIVE ng/mL

## 2014-02-10 NOTE — Telephone Encounter (Signed)
UDS positive for Alcohol, patient is wanting a new prescription for pain meds, we D/C Morphine due to constipation... please advise

## 2014-02-10 NOTE — Telephone Encounter (Signed)
Initial UDS was positive for EtOH however follow-up was not. If patient becomes positive again for EtOH then would discharge. Constipation is a common side effect of all opioids and can be managed with supplemental laxatives. I recommend Senokot S 2 tablets twice a day We can call in Tylenol #4 one tablet 3 times a day#90 one refill in place of morphine, this should be less constipating

## 2014-02-10 NOTE — Telephone Encounter (Signed)
Pt called asking when his script for pain meds will be ready

## 2014-02-11 NOTE — Progress Notes (Signed)
Urine drug screen for this encounter is negative for morphine. Last reported dose was 01/31/14 and test date was 02/02/14. Metabolite present for valium which has been prescribed.

## 2014-02-11 NOTE — Telephone Encounter (Signed)
Attempted to reach Mr [name] to give him Dr Wynn Banker recommendations.  His voicemail has not been set up and could not leave message.

## 2014-02-12 NOTE — Telephone Encounter (Signed)
I spoke with [name] and he says that the tylenol # 4 will do nothing.  He is asking if there is not something intermediate between the morphine like hydrocodone or oxycodone that he can prescribe since he went from hydromorphone to morphine.  I will send back to Dr Wynn Banker.  He is aware of the ETOH issue and it will not happen again but "no one ever told me I couldn't have a beer".

## 2014-02-12 NOTE — Telephone Encounter (Signed)
The reason I recommend trying Tylenol No. 4 first is that he will only need to come in every 3 months for monitoring. If he wishes to try hydrocodone we can write for 5/325 one by mouth 3 times a day, #90. He will need to be in for monthly monitoring.

## 2014-02-13 MED ORDER — HYDROCODONE-ACETAMINOPHEN 5-325 MG PO TABS
1.0000 | ORAL_TABLET | Freq: Three times a day (TID) | ORAL | Status: DC | PRN
Start: 2014-02-13 — End: 2014-03-02

## 2014-02-13 NOTE — Telephone Encounter (Signed)
Notified Mr Maury Dus that he can have the hydrocodone but will have to be seen monthly.  He understands.  Rx printed and signed by Dr Wynn Banker

## 2014-03-02 ENCOUNTER — Ambulatory Visit (HOSPITAL_BASED_OUTPATIENT_CLINIC_OR_DEPARTMENT_OTHER): Payer: Worker's Compensation | Admitting: Physical Medicine & Rehabilitation

## 2014-03-02 ENCOUNTER — Encounter: Payer: Worker's Compensation | Attending: Registered Nurse

## 2014-03-02 ENCOUNTER — Encounter: Payer: Self-pay | Admitting: Physical Medicine & Rehabilitation

## 2014-03-02 VITALS — BP 142/90 | HR 68 | Resp 14

## 2014-03-02 DIAGNOSIS — M961 Postlaminectomy syndrome, not elsewhere classified: Secondary | ICD-10-CM | POA: Insufficient documentation

## 2014-03-02 DIAGNOSIS — M79605 Pain in left leg: Secondary | ICD-10-CM | POA: Diagnosis not present

## 2014-03-02 DIAGNOSIS — T84226D Displacement of internal fixation device of vertebrae, subsequent encounter: Secondary | ICD-10-CM

## 2014-03-02 DIAGNOSIS — G8918 Other acute postprocedural pain: Secondary | ICD-10-CM | POA: Diagnosis not present

## 2014-03-02 DIAGNOSIS — IMO0001 Reserved for inherently not codable concepts without codable children: Secondary | ICD-10-CM

## 2014-03-02 DIAGNOSIS — T849XXD Unspecified complication of internal orthopedic prosthetic device, implant and graft, subsequent encounter: Principal | ICD-10-CM

## 2014-03-02 DIAGNOSIS — M545 Low back pain: Secondary | ICD-10-CM | POA: Diagnosis present

## 2014-03-02 DIAGNOSIS — M5410 Radiculopathy, site unspecified: Secondary | ICD-10-CM | POA: Insufficient documentation

## 2014-03-02 MED ORDER — HYDROCODONE-ACETAMINOPHEN 5-325 MG PO TABS: 1.0000 | ORAL_TABLET | Freq: Three times a day (TID) | ORAL | Status: DC | PRN

## 2014-03-02 NOTE — Progress Notes (Signed)
Subjective:    Patient ID: [name], male    DOB: Aug 27, 1970, 44 y.o.   MRN: [id number]  HPI No new medical issues. Still has some wrist pain, back stiffness. Patient has numbness at night in his fingers. Has intermittent numbness during the day in his fingers as well. Patient dropping objects.Also has numbness during driving   Reviewed urine drug screens last one did not show any sign of alcohol. Discussed dangerous interaction between opioids and alcohol.  Constipation improved after switching from morphine to hydrocodone  Past mental history significant for L3-L5 fusion in 2013 Pain Inventory Average Pain 8 Pain Right Now 8 My pain is constant, sharp, burning, dull, stabbing, tingling and aching  In the last 24 hours, has pain interfered with the following? General activity 7 Relation with others 7 Enjoyment of life 7 What TIME of day is your pain at its worst? all Sleep (in general) NA  Pain is worse with: walking, bending, sitting, inactivity and standing Pain improves with: medication Relief from Meds: 5  Mobility walk without assistance  Function I need assistance with the following:  dressing  Neuro/Psych weakness numbness tingling dizziness depression  Prior Studies Any changes since last visit?  no  Physicians involved in your care Any changes since last visit?  no   Family History  Problem Relation Age of Onset  . Coronary artery disease    . Hypertension Mother   . Hyperlipidemia Father   . Heart disease Father    History   Social History  . Marital Status: Married    Spouse Name: N/A    Number of Children: N/A  . Years of Education: N/A   Social History Main Topics  . Smoking status: Never Smoker   . Smokeless tobacco: None  . Alcohol Use: No  . Drug Use: No  . Sexual Activity: Yes   Other Topics Concern  . None   Social History Narrative   Past Surgical History  Procedure Laterality Date  . Cholecystectomy    .  Spinal fusion      l4-5 then l3-5 revision with failure  . Lumbar fusion  L45 the L-S1 revision    spinal leak complication  . Lumbar laminectomy/decompression microdiscectomy  12/14/2011    Procedure: LUMBAR LAMINECTOMY/DECOMPRESSION MICRODISCECTOMY 1 LEVEL;  Surgeon: Reinaldo Meeker, MD;  Location: [facility];  Service: Neurosurgery;  Laterality: Left;  Left Lumbar Five-Sacral One Decompressive Foraminotomy   Past Medical History  Diagnosis Date  . Allergy   . Asthma   . Sinusitis, chronic   . Apnea   . Restless leg   . Spinal fusion failure     spinal fluid leak  . Pneumonia     has had 6-7 times  . Hypoglycemia   . Alpha-1-antitrypsin deficiency   . Migraines   . Sleep apnea     records on chart, severe, stops breathing 100+ times per night, uses cpap setting 18, warm air machine   BP 142/90 mmHg  Pulse 68  Resp 14  SpO2 96%  Opioid Risk Score:   Fall Risk Score: Low Fall Risk (0-5 points) (previously educated and given handout) Review of Systems  Constitutional: Positive for diaphoresis and unexpected weight change.  Respiratory: Positive for apnea and shortness of breath.   Cardiovascular: Positive for leg swelling.  Gastrointestinal: Positive for constipation.  Neurological: Positive for dizziness, weakness and numbness.       Tingling  Psychiatric/Behavioral: Positive for dysphoric mood.  All other  systems reviewed and are negative.      Objective:   Physical Exam  Constitutional: He is oriented to person, place, and time.  Neurological: He is alert and oriented to person, place, and time. He has normal strength.    Lumbar spine limited range of motion flexion extension lateral rotation and bending  Negative straight leg raise test Motor strength 5/5 bilateral hip flexion and extensor ankle dorsal flexor.      Assessment & Plan:  1. Lumbar postlaminectomy syndrome with chronic postoperative pain responding well to hydrocodone. We'll continue current  dosage. No significant constipation. Continue opioid monitoring program. Follow up with nurse practitioner in one month. M.D. In 6-12 months  2. Wrist pain and finger numbness sounds like carpal tunnel. Advised patient to follow-up with PCP on this.

## 2014-04-13 ENCOUNTER — Encounter: Payer: Self-pay | Admitting: Registered Nurse

## 2014-04-13 ENCOUNTER — Encounter: Payer: Worker's Compensation | Attending: Registered Nurse | Admitting: Registered Nurse

## 2014-04-13 VITALS — BP 106/56 | HR 56 | Resp 14

## 2014-04-13 DIAGNOSIS — IMO0001 Reserved for inherently not codable concepts without codable children: Secondary | ICD-10-CM

## 2014-04-13 DIAGNOSIS — M5410 Radiculopathy, site unspecified: Secondary | ICD-10-CM | POA: Insufficient documentation

## 2014-04-13 DIAGNOSIS — M961 Postlaminectomy syndrome, not elsewhere classified: Secondary | ICD-10-CM

## 2014-04-13 DIAGNOSIS — M545 Low back pain: Secondary | ICD-10-CM | POA: Insufficient documentation

## 2014-04-13 DIAGNOSIS — Z79899 Other long term (current) drug therapy: Secondary | ICD-10-CM

## 2014-04-13 DIAGNOSIS — G8918 Other acute postprocedural pain: Secondary | ICD-10-CM | POA: Diagnosis not present

## 2014-04-13 DIAGNOSIS — T849XXD Unspecified complication of internal orthopedic prosthetic device, implant and graft, subsequent encounter: Secondary | ICD-10-CM

## 2014-04-13 DIAGNOSIS — M79605 Pain in left leg: Secondary | ICD-10-CM | POA: Insufficient documentation

## 2014-04-13 DIAGNOSIS — Z5181 Encounter for therapeutic drug level monitoring: Secondary | ICD-10-CM

## 2014-04-13 DIAGNOSIS — G8929 Other chronic pain: Secondary | ICD-10-CM

## 2014-04-13 DIAGNOSIS — G894 Chronic pain syndrome: Secondary | ICD-10-CM

## 2014-04-13 DIAGNOSIS — T84226D Displacement of internal fixation device of vertebrae, subsequent encounter: Secondary | ICD-10-CM

## 2014-04-13 MED ORDER — CLONAZEPAM 1 MG PO TABS: ORAL_TABLET | ORAL | Status: AC

## 2014-04-13 MED ORDER — HYDROCODONE-ACETAMINOPHEN 5-325 MG PO TABS: 1.0000 | ORAL_TABLET | Freq: Three times a day (TID) | ORAL | Status: DC | PRN

## 2014-04-13 MED ORDER — GABAPENTIN 300 MG PO CAPS: 600.0000 mg | ORAL_CAPSULE | Freq: Three times a day (TID) | ORAL | Status: DC

## 2014-04-13 NOTE — Progress Notes (Signed)
Subjective:    Patient ID: [name], male    DOB: 12/06/1970, 44 y.o.   MRN: [id number]  HPI: [name] is a 44 year old male who returns for follow up for chronic pain and medication refill. He says his pain is located in his left arm, left leg and lower back. Also complaining of bilateral wrist pain in the process of obtaing a PCP.He rates his pain 8. His current exercise regime is walking.  Pain Inventory Average Pain 8 Pain Right Now 8 My pain is constant, sharp, burning, stabbing, tingling and aching  In the last 24 hours, has pain interfered with the following? General activity 8 Relation with others 0 Enjoyment of life 9 What TIME of day is your pain at its worst? daytime, night Sleep (in general) Poor  Pain is worse with: walking, bending, sitting and standing Pain improves with: medication Relief from Meds: 3  Mobility walk without assistance ability to climb steps?  yes do you drive?  yes  Function not employed: date last employed 2002  Neuro/Psych weakness numbness tremor tingling trouble walking spasms depression  Prior Studies Any changes since last visit?  no  Physicians involved in your care Any changes since last visit?  no   Family History  Problem Relation Age of Onset  . Coronary artery disease    . Hypertension Mother   . Hyperlipidemia Father   . Heart disease Father    History   Social History  . Marital Status: Married    Spouse Name: N/A  . Number of Children: N/A  . Years of Education: N/A   Social History Main Topics  . Smoking status: Never Smoker   . Smokeless tobacco: Not on file  . Alcohol Use: No  . Drug Use: No  . Sexual Activity: Yes   Other Topics Concern  . None   Social History Narrative   Past Surgical History  Procedure Laterality Date  . Cholecystectomy    . Spinal fusion      l4-5 then l3-5 revision with failure  . Lumbar fusion  L45 the L-S1 revision    spinal leak complication   . Lumbar laminectomy/decompression microdiscectomy  12/14/2011    Procedure: LUMBAR LAMINECTOMY/DECOMPRESSION MICRODISCECTOMY 1 LEVEL;  Surgeon: Reinaldo Meeker, MD;  Location: [facility];  Service: Neurosurgery;  Laterality: Left;  Left Lumbar Five-Sacral One Decompressive Foraminotomy   Past Medical History  Diagnosis Date  . Allergy   . Asthma   . Sinusitis, chronic   . Apnea   . Restless leg   . Spinal fusion failure     spinal fluid leak  . Pneumonia     has had 6-7 times  . Hypoglycemia   . Alpha-1-antitrypsin deficiency   . Migraines   . Sleep apnea     records on chart, severe, stops breathing 100+ times per night, uses cpap setting 18, warm air machine   There were no vitals taken for this visit.  Opioid Risk Score:   Fall Risk Score: Moderate Fall Risk (6-13 points)  Review of Systems  Respiratory: Positive for apnea and shortness of breath.   Gastrointestinal: Positive for constipation.  Musculoskeletal: Positive for gait problem.  Neurological: Positive for tremors, weakness and numbness.       Tingling Spasms   Psychiatric/Behavioral: Positive for dysphoric mood.  All other systems reviewed and are negative.      Objective:   Physical Exam  Constitutional: He is oriented to  person, place, and time. He appears well-developed and well-nourished.  HENT:  Head: Normocephalic and atraumatic.  Neck: Normal range of motion. Neck supple.  Cardiovascular: Normal rate and regular rhythm.   Pulmonary/Chest: Effort normal and breath sounds normal.  Musculoskeletal:  Normal Muscle Bulk and Muscle Testing Reveals: Upper Extremities: Full ROM and Muscle strength 5/5 Lumbar Paraspinal Tenderness: L-3- L-5 Lower Extremities: Full ROM and Muscle Strength  Left Lower Extremity Flexion Produces Pain into lower extremity Arises from chair with ease Narrow Based Gait  Neurological: He is alert and oriented to person, place, and time.  Skin: Skin is warm and dry.    Psychiatric: He has a normal mood and affect.  Nursing note and vitals reviewed.         Assessment & Plan:  1. 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 #90.   20 minutes of face to face patient care time was spent during this visit. All questions were encouraged and answered. F/U in 1 month

## 2014-05-12 ENCOUNTER — Telehealth: Payer: Self-pay | Admitting: *Deleted

## 2014-05-12 NOTE — Telephone Encounter (Signed)
Tou called and left a message on clinic line to call him back about his medication.  I have tried to call but there was no answer and voicemail is not taking message.  I looked at his last medication rx to see if I could determine what he might be calling about.  His hydrocodone on the day of his 04/13/14 visit was #29 which is a 9 day supply, plus he was given a new rx on 04/13/14.  His next appt is 05/25/14.  The 9 days plus the new rx would only last until 05/21/14, so I am wondering if he is calling about him going to be out of meds before visit.  If this is the case he needs to have his appt moved to 05/21/14 or before.

## 2014-05-13 NOTE — Telephone Encounter (Addendum)
I spoke with Mr [name] this morning and he has not been able to get his RX filled for his medications from the 04/13/14 visit with Riley Lam.  Workmans comp will not honor anything not signed by Dr Wynn Banker and he has been fighting with them about this.  I checked and his last rx filled was 03/27/14 by Dr Wynn Banker.  I explained that Dr Wynn Banker is not in the office today but will be back tomorrow.  His next appt is 05/25/14 with Riley Lam and this will need to be changed to [place].  I verified with the pharmacy and [facility] that he has not filled the 04/13/14 Rx for hydrocodone.  He has not had his gabapentin either  He filled the clonazepam and it was not run through [facility] comp as the reason they could fill it.  In trying to re-order the gabapentin Dr Wynn Banker original note says 600 mg gabapentin 5 times per day. But the order placed was for (2) 300mg  capsules( 600 mg) tid but only #120 dispensed.  He says tid would not even touch his leg issue.  Do I place a re-order for 600 mg 5 times day as your note says( disp 600 mg tablets instead of capsules #150?) I will reprint a hydrocodone rx for Dr Wynn Banker to sign tomorrow and change appt schedule.

## 2014-05-13 NOTE — Telephone Encounter (Signed)
Do I place a re-order for 600 mg 5 times day as your note says( disp 600 mg tablets instead of capsules #150?) I will reprint a hydrocodone rx for Dr Wynn Banker to sign tomorrow and change appt schedule.  I don't understand why they won't fill my RX, why does he need to see me if [facility] doesn't honor my Rx

## 2014-05-13 NOTE — Telephone Encounter (Signed)
Sorry for the confusion,  They will refill yours, It was [name] as the prescriber that they will not honor.  And I just had the question about how the gabapentin was actually supposed to be written since the disp didn't match the sig and your note was different as well.  I was going to change the prescriber from [place] to you on the meds.

## 2014-05-14 MED ORDER — HYDROCODONE-ACETAMINOPHEN 5-325 MG PO TABS: 1.0000 | ORAL_TABLET | Freq: Three times a day (TID) | ORAL | Status: DC | PRN

## 2014-05-14 MED ORDER — GABAPENTIN 600 MG PO TABS: ORAL_TABLET | ORAL | Status: DC

## 2014-05-14 NOTE — Telephone Encounter (Signed)
Can you look at your note from the 12/25/2013 visit and clarify the gabapentin order? Your note said 600mg  5 times per day but you ordered #2 of 300mg  capsules tid but only disp #120.  The issue is 2 capsules tid is 6 per day  And that disp would be#180 .  The note indicated it should be 600 mg 5 times per day. It has not been mentioned since and I dont want to renew this order incorrectly.

## 2014-05-14 NOTE — Telephone Encounter (Signed)
Ok done and notified [name]. [facility] written by Riley Lam for the hydrocodone and he can pick up new rx by [employer] tomorrow

## 2014-05-14 NOTE — Telephone Encounter (Signed)
I think my note was a dictation typo, it should read Gabaentin  4-5 times a day.    Lets make order Gabapentin  QID #[id number] 1 RF

## 2014-05-19 ENCOUNTER — Telehealth: Payer: Self-pay | Admitting: *Deleted

## 2014-05-19 NOTE — Telephone Encounter (Signed)
Notified Mr [name] that I have his Rx for hydrocodone by Dr Wynn Banker to pick up and have changed his appt to 06/08/14 with Kirsteins

## 2014-05-25 ENCOUNTER — Ambulatory Visit: Payer: Self-pay | Admitting: Registered Nurse

## 2014-06-08 ENCOUNTER — Ambulatory Visit: Payer: Self-pay | Admitting: Physical Medicine & Rehabilitation

## 2014-06-12 ENCOUNTER — Ambulatory Visit: Payer: Worker's Compensation | Admitting: Physical Medicine & Rehabilitation

## 2014-06-19 ENCOUNTER — Ambulatory Visit (HOSPITAL_BASED_OUTPATIENT_CLINIC_OR_DEPARTMENT_OTHER): Payer: Worker's Compensation | Admitting: Physical Medicine & Rehabilitation

## 2014-06-19 ENCOUNTER — Encounter: Payer: Worker's Compensation | Attending: Registered Nurse

## 2014-06-19 ENCOUNTER — Encounter: Payer: Self-pay | Admitting: Physical Medicine & Rehabilitation

## 2014-06-19 ENCOUNTER — Other Ambulatory Visit: Payer: Self-pay | Admitting: Physical Medicine & Rehabilitation

## 2014-06-19 VITALS — BP 130/67 | HR 97 | Resp 16

## 2014-06-19 DIAGNOSIS — M961 Postlaminectomy syndrome, not elsewhere classified: Secondary | ICD-10-CM

## 2014-06-19 DIAGNOSIS — M79605 Pain in left leg: Secondary | ICD-10-CM | POA: Diagnosis not present

## 2014-06-19 DIAGNOSIS — T849XXS Unspecified complication of internal orthopedic prosthetic device, implant and graft, sequela: Secondary | ICD-10-CM

## 2014-06-19 DIAGNOSIS — G894 Chronic pain syndrome: Secondary | ICD-10-CM | POA: Insufficient documentation

## 2014-06-19 DIAGNOSIS — G8918 Other acute postprocedural pain: Secondary | ICD-10-CM | POA: Diagnosis not present

## 2014-06-19 DIAGNOSIS — Z79899 Other long term (current) drug therapy: Secondary | ICD-10-CM

## 2014-06-19 DIAGNOSIS — IMO0001 Reserved for inherently not codable concepts without codable children: Secondary | ICD-10-CM

## 2014-06-19 DIAGNOSIS — Z5181 Encounter for therapeutic drug level monitoring: Secondary | ICD-10-CM

## 2014-06-19 DIAGNOSIS — M545 Low back pain: Secondary | ICD-10-CM | POA: Insufficient documentation

## 2014-06-19 DIAGNOSIS — M5106 Intervertebral disc disorders with myelopathy, lumbar region: Secondary | ICD-10-CM | POA: Diagnosis not present

## 2014-06-19 DIAGNOSIS — M5410 Radiculopathy, site unspecified: Secondary | ICD-10-CM | POA: Diagnosis not present

## 2014-06-19 DIAGNOSIS — T84226S Displacement of internal fixation device of vertebrae, sequela: Secondary | ICD-10-CM

## 2014-06-19 MED ORDER — HYDROCODONE-ACETAMINOPHEN 5-325 MG PO TABS: 1.0000 | ORAL_TABLET | Freq: Three times a day (TID) | ORAL | Status: DC | PRN

## 2014-06-19 NOTE — Progress Notes (Signed)
   Subjective:    Patient ID: [name], male    DOB: 1970-08-22, 44 y.o.   MRN: [id number]  HPI 44 year old male with the workers comp injury in 2010 with back injury. He failed conservative care and ultimately underwent L3-L5 fusion. He has been on a stable narcotic analgesic regimen for over a year. Low-dose hydrocodone 5 mg 3 times a day He has seen anesthesiologist to evaluate for spinal cord stimulator trial about 2 months ago. Recommendations were made for weight loss. Patient initially lost 30 pounds but then gained some of that back.  He does not currently have a primary care physician since his moved into [employer]. I encouraged him to find another primary care physician   Review of Systems Patient has intermittent lower extremity jerks, prior history of restless leg syndrome,, No progressive numbness or tingling or weakness. He does have chronic weakness of the left lower extremity as well as chronic numbness of the left foot    Objective:   Physical Exam  Morbidly obese male in no acute distress Mood and affect are appropriate Extremities without edema Musculoskeletal negative straight leg raising test Lumbar spine without tenderness palpation Well-healed midline surgical incisions Limited lumbar range of motion approximately 50% flexion extension lateral bending and rotation        Assessment & Plan:  1. Lumbar postlaminectomy syndrome with chronic postoperative pain status post L3-L5 fusion in 2013. He remains functional at least from self-care and household standpoint. He asked if he can ride a lawnmower which I think would be fine for him. He states that he gets his 31 year old daughter to do the edging. He could probably do that as well.  We'll continue hydrocodone 5 mg 3 times a day, no signs of aberrant drug behavior Continue opioid monitoring program. This consists of regular clinic visits, examinations, urine drug screen, pill counts as  well as use of [place] controlled substance reporting System. Apparently orders, insurance is insisting on my signature on his narcotic analgesic prescriptions. My nurse practitioner ran into some problems with that. This has not been issue with other carriers. Continue gabapentin 600 mg 4 times a day  Recommend established with new PCP  2. Restless leg syndrome that should be evaluated by PCP once he obtains

## 2014-06-19 NOTE — Progress Notes (Signed)
Subjective:    Patient ID: [name], male    DOB: April 18, 1970, 44 y.o.   MRN: [id number]  HPI  Pain Inventory Average Pain 8 Pain Right Now 8 My pain is sharp, burning, dull, stabbing, tingling and aching  In the last 24 hours, has pain interfered with the following? General activity 6 Relation with others 7 Enjoyment of life 5 What TIME of day is your pain at its worst? daytime evening and night Sleep (in general) Fair  Pain is worse with: walking, bending, sitting, standing and some activites Pain improves with: rest, heat/ice and medication Relief from Meds: 5  Mobility walk without assistance how many minutes can you walk? 10 ability to climb steps?  yes do you drive?  yes  Function I need assistance with the following:  dressing and shopping  Neuro/Psych numbness tingling trouble walking spasms  Prior Studies Any changes since last visit?  no  Physicians involved in your care Any changes since last visit?  no   Family History  Problem Relation Age of Onset  . Coronary artery disease    . Hypertension Mother   . Hyperlipidemia Father   . Heart disease Father    History   Social History  . Marital Status: Married    Spouse Name: N/A  . Number of Children: N/A  . Years of Education: N/A   Social History Main Topics  . Smoking status: Never Smoker   . Smokeless tobacco: Not on file  . Alcohol Use: No  . Drug Use: No  . Sexual Activity: Yes   Other Topics Concern  . None   Social History Narrative   Past Surgical History  Procedure Laterality Date  . Cholecystectomy    . Spinal fusion      l4-5 then l3-5 revision with failure  . Lumbar fusion  L45 the L-S1 revision    spinal leak complication  . Lumbar laminectomy/decompression microdiscectomy  12/14/2011    Procedure: LUMBAR LAMINECTOMY/DECOMPRESSION MICRODISCECTOMY 1 LEVEL;  Surgeon: Reinaldo Meeker, MD;  Location: [facility];  Service: Neurosurgery;  Laterality: Left;  Left  Lumbar Five-Sacral One Decompressive Foraminotomy   Past Medical History  Diagnosis Date  . Allergy   . Asthma   . Sinusitis, chronic   . Apnea   . Restless leg   . Spinal fusion failure     spinal fluid leak  . Pneumonia     has had 6-7 times  . Hypoglycemia   . Alpha-1-antitrypsin deficiency   . Migraines   . Sleep apnea     records on chart, severe, stops breathing 100+ times per night, uses cpap setting 18, warm air machine   BP 130/67 mmHg  Pulse 97  Resp 16  SpO2 95%  Opioid Risk Score:   Fall Risk Score: Low Fall Risk (0-5 points)`1  Depression screen PHQ 2/9  Depression screen PHQ 2/9 06/19/2014  Decreased Interest 3  Down, Depressed, Hopeless 2  PHQ - 2 Score 5  Altered sleeping 1  Tired, decreased energy 3  Change in appetite 3  Feeling bad or failure about yourself  1  Trouble concentrating 1  Moving slowly or fidgety/restless 3  Suicidal thoughts 1  PHQ-9 Score 18     Review of Systems  Musculoskeletal: Positive for gait problem.       Spasms  Neurological: Positive for numbness.       Tingling  All other systems reviewed and are negative.  Objective:   Physical Exam        Assessment & Plan:

## 2014-06-20 LAB — PMP ALCOHOL METABOLITE (ETG): ETGU: NEGATIVE ng/mL

## 2014-06-23 LAB — OPIATES/OPIOIDS (LC/MS-MS)
Codeine Urine: NEGATIVE ng/mL (ref <50)
Hydrocodone: 253 ng/mL (ref <50)
Hydromorphone: 125 ng/mL (ref <50)
MORPHINE: NEGATIVE ng/mL (ref <50)
NORHYDROCODONE, UR: 360 ng/mL (ref <50)
NOROXYCODONE, UR: NEGATIVE ng/mL (ref <50)
OXYCODONE, UR: NEGATIVE ng/mL (ref <50)
OXYMORPHONE, URINE: NEGATIVE ng/mL (ref <50)

## 2014-06-24 LAB — PRESCRIPTION MONITORING PROFILE (SOLSTAS)
Amphetamine/Meth: NEGATIVE ng/mL
BARBITURATE SCREEN, URINE: NEGATIVE ng/mL
BENZODIAZEPINE SCREEN, URINE: NEGATIVE ng/mL
BUPRENORPHINE, URINE: NEGATIVE ng/mL
Cannabinoid Scrn, Ur: NEGATIVE ng/mL
Carisoprodol, Urine: NEGATIVE ng/mL
Cocaine Metabolites: NEGATIVE ng/mL
Creatinine, Urine: 234.9 mg/dL (ref >20.0)
Fentanyl, Ur: NEGATIVE ng/mL
MDMA URINE: NEGATIVE ng/mL
METHADONE SCREEN, URINE: NEGATIVE ng/mL
Meperidine, Ur: NEGATIVE ng/mL
Nitrites, Initial: NEGATIVE ug/mL
Oxycodone Screen, Ur: NEGATIVE ng/mL
PROPOXYPHENE: NEGATIVE ng/mL
Tapentadol, urine: NEGATIVE ng/mL
Tramadol Scrn, Ur: NEGATIVE ng/mL
ZOLPIDEM, URINE: NEGATIVE ng/mL
pH, Initial: 6.6 pH (ref 4.5–8.9)

## 2014-06-25 NOTE — Progress Notes (Signed)
Urine drug screen for this encounter is consistent for prescribed medication 

## 2014-07-17 ENCOUNTER — Encounter: Payer: Worker's Compensation | Attending: Registered Nurse | Admitting: Registered Nurse

## 2014-07-17 DIAGNOSIS — G8918 Other acute postprocedural pain: Secondary | ICD-10-CM | POA: Insufficient documentation

## 2014-07-17 DIAGNOSIS — M79605 Pain in left leg: Secondary | ICD-10-CM | POA: Insufficient documentation

## 2014-07-17 DIAGNOSIS — M961 Postlaminectomy syndrome, not elsewhere classified: Secondary | ICD-10-CM | POA: Insufficient documentation

## 2014-07-17 DIAGNOSIS — M5410 Radiculopathy, site unspecified: Secondary | ICD-10-CM | POA: Insufficient documentation

## 2014-07-17 DIAGNOSIS — M545 Low back pain: Secondary | ICD-10-CM | POA: Insufficient documentation

## 2014-08-11 ENCOUNTER — Ambulatory Visit: Payer: Self-pay | Admitting: Physical Medicine & Rehabilitation

## 2014-08-11 ENCOUNTER — Other Ambulatory Visit: Payer: Self-pay | Admitting: *Deleted

## 2014-08-11 MED ORDER — HYDROCODONE-ACETAMINOPHEN 5-325 MG PO TABS: 1.0000 | ORAL_TABLET | Freq: Three times a day (TID) | ORAL | Status: DC | PRN

## 2014-08-11 NOTE — Telephone Encounter (Signed)
[name] has appt with [name] 08/26/14.  He has not had a refill since 06/08/14 by Dr [name] per [employer].  I have printed a bridge of hydrocodone 5/325 1 tid prn for  16 days to get to the 7/20 appt (#48) for him to pick up. ( he came in to the office front desk this am to request).  He will return after Dr [name] is able to sign for this. (Dr [name] and [name] out of office for the week)

## 2014-08-26 ENCOUNTER — Encounter: Payer: Worker's Compensation | Attending: Registered Nurse | Admitting: Registered Nurse

## 2014-08-26 ENCOUNTER — Encounter: Payer: Self-pay | Admitting: Registered Nurse

## 2014-08-26 VITALS — BP 116/75 | HR 59 | Resp 16

## 2014-08-26 DIAGNOSIS — M5416 Radiculopathy, lumbar region: Secondary | ICD-10-CM | POA: Diagnosis not present

## 2014-08-26 DIAGNOSIS — Z79899 Other long term (current) drug therapy: Secondary | ICD-10-CM

## 2014-08-26 DIAGNOSIS — M5410 Radiculopathy, site unspecified: Secondary | ICD-10-CM | POA: Insufficient documentation

## 2014-08-26 DIAGNOSIS — G8918 Other acute postprocedural pain: Secondary | ICD-10-CM | POA: Diagnosis not present

## 2014-08-26 DIAGNOSIS — M79605 Pain in left leg: Secondary | ICD-10-CM | POA: Diagnosis not present

## 2014-08-26 DIAGNOSIS — M545 Low back pain: Secondary | ICD-10-CM | POA: Insufficient documentation

## 2014-08-26 DIAGNOSIS — F329 Major depressive disorder, single episode, unspecified: Secondary | ICD-10-CM

## 2014-08-26 DIAGNOSIS — G894 Chronic pain syndrome: Secondary | ICD-10-CM | POA: Diagnosis not present

## 2014-08-26 DIAGNOSIS — Z5181 Encounter for therapeutic drug level monitoring: Secondary | ICD-10-CM | POA: Diagnosis not present

## 2014-08-26 DIAGNOSIS — M961 Postlaminectomy syndrome, not elsewhere classified: Secondary | ICD-10-CM | POA: Insufficient documentation

## 2014-08-26 DIAGNOSIS — F32A Depression, unspecified: Secondary | ICD-10-CM

## 2014-08-26 MED ORDER — HYDROCODONE-ACETAMINOPHEN 5-325 MG PO TABS: 1.0000 | ORAL_TABLET | Freq: Three times a day (TID) | ORAL | Status: DC | PRN

## 2014-08-26 NOTE — Progress Notes (Signed)
Subjective:    Patient ID: [name], male    DOB: [birth year], 44 y.o.   MRN: [id number]  HPI: [name] is a 44 year old male who returns for follow up for chronic pain and medication refill. He says his pain is located in his lower back radiating into lower extremities anterioraly and laterally. Also has complaining of bilateral wrist pain will schedule with Dr. Wynn Banker for EMG or cortisone injection. Marland KitchenHe rates his pain 7. His current exercise regime is walking. His PHQ score is 21, on cymbalta admits to depression denies suicidal ideation or plan. Interesting in counseling will place a referral to Dr. Leonides Cave he verbalizes understanding.   Pain Inventory Average Pain 8 Pain Right Now 7 My pain is sharp, burning, dull, stabbing, tingling and aching  In the last 24 hours, has pain interfered with the following? General activity 7 Relation with others 6 Enjoyment of life 7 What TIME of day is your pain at its worst? all Sleep (in general) NA  Pain is worse with: walking, bending, sitting and standing Pain improves with: rest and medication Relief from Meds: did not answer  Mobility walk without assistance  Function I need assistance with the following:  dressing  Neuro/Psych weakness numbness tremor tingling trouble walking depression  Prior Studies Any changes since last visit?  no  Physicians involved in your care Any changes since last visit?  no   Family History  Problem Relation Age of Onset  . Coronary artery disease    . Hypertension Mother   . Hyperlipidemia Father   . Heart disease Father    History   Social History  . Marital Status: Married    Spouse Name: N/A  . Number of Children: N/A  . Years of Education: N/A   Social History Main Topics  . Smoking status: Never Smoker   . Smokeless tobacco: Not on file  . Alcohol Use: No  . Drug Use: No  . Sexual Activity: Yes   Other Topics Concern  . None   Social History  Narrative   Past Surgical History  Procedure Laterality Date  . Cholecystectomy    . Spinal fusion      l4-5 then l3-5 revision with failure  . Lumbar fusion  L45 the L-S1 revision    spinal leak complication  . Lumbar laminectomy/decompression microdiscectomy  12/14/2011    Procedure: LUMBAR LAMINECTOMY/DECOMPRESSION MICRODISCECTOMY 1 LEVEL;  Surgeon: Reinaldo Meeker, MD;  Location: [facility];  Service: Neurosurgery;  Laterality: Left;  Left Lumbar Five-Sacral One Decompressive Foraminotomy   Past Medical History  Diagnosis Date  . Allergy   . Asthma   . Sinusitis, chronic   . Apnea   . Restless leg   . Spinal fusion failure     spinal fluid leak  . Pneumonia     has had 6-7 times  . Hypoglycemia   . Alpha-1-antitrypsin deficiency   . Migraines   . Sleep apnea     records on chart, severe, stops breathing 100+ times per night, uses cpap setting 18, warm air machine   BP 116/75 mmHg  Pulse 59  Resp 16  SpO2 96%  Opioid Risk Score:   Fall Risk Score:  `1  Depression screen PHQ 2/9  Depression screen [facility] 2/9 08/26/2014 06/19/2014  Decreased Interest 2 3  Down, Depressed, Hopeless 3 2  PHQ - 2 Score 5 5  Altered sleeping 2 1  Tired, decreased energy 3 3  Change in appetite 3 3  Feeling bad or failure about yourself  2 1  Trouble concentrating 2 1  Moving slowly or fidgety/restless 3 3  Suicidal thoughts 1 1  PHQ-9 Score 21 18  Difficult doing work/chores Somewhat difficult -    Review of Systems  Musculoskeletal: Positive for gait problem.  Neurological: Positive for tremors, weakness and numbness.       Tingling  Psychiatric/Behavioral: Positive for dysphoric mood.  All other systems reviewed and are negative.      Objective:   Physical Exam  Constitutional: He is oriented to person, place, and time. He appears well-developed and well-nourished.  HENT:  Head: Normocephalic and atraumatic.  Neck: Normal range of motion. Neck supple.  Cardiovascular:  Normal rate and regular rhythm.   Pulmonary/Chest: Effort normal and breath sounds normal.  Musculoskeletal:  Normal Muscle Bulk and Muscle Testing Reveals: Upper Extremities: Full ROM and Muscle Strength 5/5 Lumbar Paraspinal Tenderness: L-3- L-5 Lower Extremities: Full ROM and Muscle Strength 5/5 Arises from chair with ease Narrow Based gait  Neurological: He is alert and oriented to person, place, and time.  Skin: Skin is warm and dry.  Psychiatric: He has a normal mood and affect.  Nursing note and vitals reviewed.         Assessment & Plan:  1. 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 #90. 2. Depression: Referral Dr. Leonides Cave  20 minutes of face to face patient care time was spent during this visit. All questions were encouraged and answered. F/U in 1 month

## 2014-09-02 ENCOUNTER — Other Ambulatory Visit: Payer: Self-pay | Admitting: Physical Medicine & Rehabilitation

## 2014-09-29 ENCOUNTER — Other Ambulatory Visit: Payer: Self-pay | Admitting: Physical Medicine & Rehabilitation

## 2014-09-29 ENCOUNTER — Ambulatory Visit: Payer: Worker's Compensation | Admitting: Physical Medicine & Rehabilitation

## 2014-10-08 ENCOUNTER — Encounter: Payer: Worker's Compensation | Attending: Registered Nurse

## 2014-10-08 ENCOUNTER — Encounter: Payer: Self-pay | Admitting: Physical Medicine & Rehabilitation

## 2014-10-08 ENCOUNTER — Ambulatory Visit (HOSPITAL_BASED_OUTPATIENT_CLINIC_OR_DEPARTMENT_OTHER): Payer: Worker's Compensation | Admitting: Physical Medicine & Rehabilitation

## 2014-10-08 VITALS — BP 122/6 | HR 66 | Resp 14

## 2014-10-08 DIAGNOSIS — M545 Low back pain: Secondary | ICD-10-CM | POA: Insufficient documentation

## 2014-10-08 DIAGNOSIS — M961 Postlaminectomy syndrome, not elsewhere classified: Secondary | ICD-10-CM

## 2014-10-08 DIAGNOSIS — M79605 Pain in left leg: Secondary | ICD-10-CM | POA: Diagnosis not present

## 2014-10-08 DIAGNOSIS — R202 Paresthesia of skin: Secondary | ICD-10-CM | POA: Diagnosis not present

## 2014-10-08 DIAGNOSIS — G8918 Other acute postprocedural pain: Secondary | ICD-10-CM | POA: Diagnosis not present

## 2014-10-08 DIAGNOSIS — M5410 Radiculopathy, site unspecified: Secondary | ICD-10-CM | POA: Diagnosis not present

## 2014-10-08 MED ORDER — HYDROCODONE-ACETAMINOPHEN 5-325 MG PO TABS: 1.0000 | ORAL_TABLET | Freq: Three times a day (TID) | ORAL | Status: DC | PRN

## 2014-10-08 NOTE — Patient Instructions (Signed)
We will attempt to get Worker's Comp. To approve the EMG to check for carpal tunnel.

## 2014-10-08 NOTE — Progress Notes (Signed)
   Subjective:    Patient ID: [name], male    DOB: 06-22-1970, 44 y.o.   MRN: [id number]  HPI 44 year old male with history of L34, L4-5 fusionIn 2013 following a work injury 2010 of the lumbar spine. He has been on stable low-dose narcotic analgesics for several years. We discussed increasing activity as well as losing weight. He is now walking about 20 minutes at a time which is his maximum Before pain increases. In addition he has Lost about 40 pounds in the last 4 months.  His chief complaint today is bilateral hand pain. He does have hand numbness. Activities such as reading a book increase the problem. He does drop objects. He states that for 3 years he was using a walker on a regular basis. Now uses a walker on an occasional basis. He states he did not have this problem prior to his work injury.   Review of Systems Denies any neck pain, no shoulder pain or elbow pain. Has low back pain, unchanged, no bowel or bladder issues    Objective:   Physical Exam  Constitutional: He is oriented to person, place, and time. He appears well-developed and well-nourished.  HENT:  Head: Normocephalic and atraumatic.  Eyes: Conjunctivae and EOM are normal. Pupils are equal, round, and reactive to light.  Neck: Normal range of motion. Neck supple.  Neurological: He is alert and oriented to person, place, and time. He has normal reflexes.  Positive Phalen's bilateral causing numbness in the second third fourth digits. Negative Tinel's at the wrist Wrist pain with hyperextension  There is also numbness on pinprick exam in the Right little finger   Psychiatric: His speech is normal and behavior is normal. Judgment normal. His affect is blunt. Cognition and memory are normal. He expresses no suicidal ideation. He expresses no suicidal plans.  Nursing note and vitals reviewed.         Assessment & Plan:  1. 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 He has been  quite functional on his current dose of hydrocodone 5 mg 3 times per day. He is increasing activity level doing more walking. He is also losing weight. Apparently 40 pounds over the last 4 months His walking tolerance is about 20 minutes, We discussed that he can walk twice a day each time for 15 minutes and thereby increasing his total time to 30 minutes  Continue opioid monitoring program. This consists of regular clinic visits, examinations, urine drug screen, pill counts as well as use of [place] controlled substance reporting System.No signs of misuse  2. Bilateral upper extremity paresthesias. He has symptoms suggestive of carpal tunnel syndrome but also some physical exam findings suggesting ulnar neuropathy. Patient has been using a walker or cane for several years. This has been associated with carpal tunnel. His walker use was related to his back problem and this was related to his work injury in 2010.No sign of cervical radiculopathy Recommend EMG/NCV to further evaluate Upper extremity symptoms.

## 2014-11-02 ENCOUNTER — Ambulatory Visit: Payer: Worker's Compensation | Admitting: Physical Medicine & Rehabilitation

## 2014-12-07 ENCOUNTER — Encounter: Payer: Worker's Compensation | Attending: Physical Medicine & Rehabilitation

## 2014-12-07 ENCOUNTER — Ambulatory Visit (HOSPITAL_BASED_OUTPATIENT_CLINIC_OR_DEPARTMENT_OTHER): Payer: Worker's Compensation | Admitting: Physical Medicine & Rehabilitation

## 2014-12-07 ENCOUNTER — Encounter: Payer: Self-pay | Admitting: Physical Medicine & Rehabilitation

## 2014-12-07 VITALS — BP 119/67 | HR 70

## 2014-12-07 DIAGNOSIS — J45909 Unspecified asthma, uncomplicated: Secondary | ICD-10-CM | POA: Insufficient documentation

## 2014-12-07 DIAGNOSIS — E8801 Alpha-1-antitrypsin deficiency: Secondary | ICD-10-CM | POA: Insufficient documentation

## 2014-12-07 DIAGNOSIS — F32A Depression, unspecified: Secondary | ICD-10-CM

## 2014-12-07 DIAGNOSIS — F329 Major depressive disorder, single episode, unspecified: Secondary | ICD-10-CM

## 2014-12-07 DIAGNOSIS — G43909 Migraine, unspecified, not intractable, without status migrainosus: Secondary | ICD-10-CM | POA: Diagnosis not present

## 2014-12-07 DIAGNOSIS — G2581 Restless legs syndrome: Secondary | ICD-10-CM | POA: Diagnosis not present

## 2014-12-07 DIAGNOSIS — Z79891 Long term (current) use of opiate analgesic: Secondary | ICD-10-CM | POA: Diagnosis not present

## 2014-12-07 DIAGNOSIS — M961 Postlaminectomy syndrome, not elsewhere classified: Secondary | ICD-10-CM | POA: Insufficient documentation

## 2014-12-07 DIAGNOSIS — R202 Paresthesia of skin: Secondary | ICD-10-CM

## 2014-12-07 DIAGNOSIS — E162 Hypoglycemia, unspecified: Secondary | ICD-10-CM | POA: Diagnosis not present

## 2014-12-07 DIAGNOSIS — G4733 Obstructive sleep apnea (adult) (pediatric): Secondary | ICD-10-CM | POA: Insufficient documentation

## 2014-12-07 MED ORDER — HYDROCODONE-ACETAMINOPHEN 5-325 MG PO TABS: 1.0000 | ORAL_TABLET | Freq: Three times a day (TID) | ORAL | Status: DC | PRN

## 2014-12-07 MED ORDER — GABAPENTIN 600 MG PO TABS: 600.0000 mg | ORAL_TABLET | Freq: Four times a day (QID) | ORAL | Status: DC

## 2014-12-07 NOTE — Progress Notes (Signed)
Subjective:    Patient ID: EAN GETTEL, male    DOB: 1970-12-08, 44 y.o.   MRN: [id number]  HPI Loss 71 pounds since June, cut out white starchy foods. Also exercising more. Has doubled his walking distance. Still has hand pain numbness weakness both sides. Left side seems a little worse on the right side. Patient is right handed Continues have his chronic low back pain which is relieved by hydrocodone. No signs of drug misuse Pain Inventory Average Pain 7 Pain Right Now 8 My pain is sharp, burning, dull, stabbing, tingling and aching  In the last 24 hours, has pain interfered with the following? General activity 7 Relation with others 8 Enjoyment of life 4 What TIME of day is your pain at its worst? Daytime, evening and night Sleep (in general) Fair  Pain is worse with: walking, bending, sitting and standing Pain improves with: medication Relief from Meds: NA  Mobility walk without assistance how many minutes can you walk? 20 ability to climb steps?  yes do you drive?  yes  Function I need assistance with the following:  dressing, meal prep, household duties and shopping  Neuro/Psych weakness numbness tingling trouble walking depression  Prior Studies Any changes since last visit?  no  Physicians involved in your care Any changes since last visit?  no   Family History  Problem Relation Age of Onset  . Coronary artery disease    . Hypertension Mother   . Hyperlipidemia Father   . Heart disease Father    Social History   Social History  . Marital Status: Married    Spouse Name: N/A  . Number of Children: N/A  . Years of Education: N/A   Social History Main Topics  . Smoking status: Never Smoker   . Smokeless tobacco: None  . Alcohol Use: No  . Drug Use: No  . Sexual Activity: Yes   Other Topics Concern  . None   Social History Narrative   Past Surgical History  Procedure Laterality Date  . Cholecystectomy    . Spinal fusion      l4-5  then l3-5 revision with failure  . Lumbar fusion  L45 the L-S1 revision    spinal leak complication  . Lumbar laminectomy/decompression microdiscectomy  12/14/2011    Procedure: LUMBAR LAMINECTOMY/DECOMPRESSION MICRODISCECTOMY 1 LEVEL;  Surgeon: Reinaldo Meeker, MD;  Location: [facility];  Service: Neurosurgery;  Laterality: Left;  Left Lumbar Five-Sacral One Decompressive Foraminotomy   Past Medical History  Diagnosis Date  . Allergy   . Asthma   . Sinusitis, chronic   . Apnea   . Restless leg   . Spinal fusion failure (HCC)     spinal fluid leak  . Pneumonia     has had 6-7 times  . Hypoglycemia   . Alpha-1-antitrypsin deficiency (HCC)   . Migraines   . Sleep apnea     records on chart, severe, stops breathing 100+ times per night, uses cpap setting 18, warm air machine   BP 119/67 mmHg  Pulse 70  SpO2 98%  Opioid Risk Score:   Fall Risk Score:  `1  Depression screen PHQ 2/9  Depression screen [facility] 2/9 08/26/2014 06/19/2014  Decreased Interest 2 3  Down, Depressed, Hopeless 3 2  PHQ - 2 Score 5 5  Altered sleeping 2 1  Tired, decreased energy 3 3  Change in appetite 3 3  Feeling bad or failure about yourself  2 1  Trouble concentrating 2  1  Moving slowly or fidgety/restless 3 3  Suicidal thoughts 1 1  PHQ-9 Score 21 18  Difficult doing work/chores Somewhat difficult -     Review of Systems  Respiratory: Positive for apnea and shortness of breath.   Endocrine:       Low blood sugar  Neurological: Positive for weakness and numbness.       Gait Instability Tingling  Psychiatric/Behavioral:       Depression  All other systems reviewed and are negative.      Objective:   Physical Exam  Constitutional: He is oriented to person, place, and time. He appears well-developed.  obese  Eyes: Conjunctivae and EOM are normal. Pupils are equal, round, and reactive to light.  Neck: Normal range of motion.  Neurological: He is alert and oriented to person, place, and  time.  Psychiatric: He has a normal mood and affect.  Nursing note and vitals reviewed.  Patient has decreased sensation in the right fifth digit as well as the left second digit to pinprick. His grip strength wrist extensor strength is normal. He does have pain with wrist extension and finger extension over the knuckle area. Proximal upper extremity strength is normal.  Lumbar spine has a healed midline incision. There is tenderness around the incision but not above or below the incision.       Assessment & Plan:  1.  Lumbar post laminectomy-Chronic postoperative pain hydrocodone 5 mg 3 times per day.  2. Bilateral hand numbness tingling weakness. Clinically he appears to be left carpal tunnel and right ulnar neuropathy. We will check with EMG next month.

## 2014-12-07 NOTE — Patient Instructions (Signed)
Continue with your weight loss efforts. Continue to try to progress to walking distance

## 2015-01-05 ENCOUNTER — Other Ambulatory Visit: Payer: Self-pay | Admitting: Physical Medicine & Rehabilitation

## 2015-01-11 ENCOUNTER — Encounter: Payer: Self-pay | Admitting: Physical Medicine & Rehabilitation

## 2015-01-11 ENCOUNTER — Encounter: Payer: Worker's Compensation | Attending: Physical Medicine & Rehabilitation

## 2015-01-11 ENCOUNTER — Other Ambulatory Visit: Payer: Self-pay | Admitting: Physical Medicine & Rehabilitation

## 2015-01-11 ENCOUNTER — Ambulatory Visit (HOSPITAL_BASED_OUTPATIENT_CLINIC_OR_DEPARTMENT_OTHER): Payer: Worker's Compensation | Admitting: Physical Medicine & Rehabilitation

## 2015-01-11 VITALS — BP 151/92 | HR 54 | Resp 16

## 2015-01-11 DIAGNOSIS — G894 Chronic pain syndrome: Secondary | ICD-10-CM | POA: Diagnosis not present

## 2015-01-11 DIAGNOSIS — Z5181 Encounter for therapeutic drug level monitoring: Secondary | ICD-10-CM

## 2015-01-11 DIAGNOSIS — R202 Paresthesia of skin: Secondary | ICD-10-CM

## 2015-01-11 DIAGNOSIS — Z79899 Other long term (current) drug therapy: Secondary | ICD-10-CM | POA: Diagnosis not present

## 2015-01-11 DIAGNOSIS — G5603 Carpal tunnel syndrome, bilateral upper limbs: Secondary | ICD-10-CM

## 2015-01-11 MED ORDER — HYDROCODONE-ACETAMINOPHEN 5-325 MG PO TABS: 1.0000 | ORAL_TABLET | Freq: Three times a day (TID) | ORAL | Status: DC | PRN

## 2015-01-11 NOTE — Patient Instructions (Signed)
Referral made to orthopedics

## 2015-01-11 NOTE — Progress Notes (Signed)
Bilateral  Upper extremity electrodiagnostic studies performed today. 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

## 2015-01-12 LAB — PMP ALCOHOL METABOLITE (ETG): ETGU: NEGATIVE ng/mL

## 2015-01-13 LAB — PRESCRIPTION MONITORING PROFILE (SOLSTAS)
Amphetamine/Meth: NEGATIVE ng/mL
BARBITURATE SCREEN, URINE: NEGATIVE ng/mL
BENZODIAZEPINE SCREEN, URINE: NEGATIVE ng/mL
Buprenorphine, Urine: NEGATIVE ng/mL
Cannabinoid Scrn, Ur: NEGATIVE ng/mL
Carisoprodol, Urine: NEGATIVE ng/mL
Cocaine Metabolites: NEGATIVE ng/mL
Creatinine, Urine: 170.08 mg/dL (ref >20.0)
ECSTASY: NEGATIVE ng/mL
Fentanyl, Ur: NEGATIVE ng/mL
METHADONE SCREEN, URINE: NEGATIVE ng/mL
Meperidine, Ur: NEGATIVE ng/mL
NITRITES URINE, INITIAL: NEGATIVE ug/mL
OPIATE SCREEN, URINE: NEGATIVE ng/mL
Oxycodone Screen, Ur: NEGATIVE ng/mL
PROPOXYPHENE: NEGATIVE ng/mL
TAPENTADOLUR: NEGATIVE ng/mL
Tramadol Scrn, Ur: NEGATIVE ng/mL
Zolpidem, Urine: NEGATIVE ng/mL
pH, Initial: 5.5 pH (ref 4.5–8.9)

## 2015-01-20 NOTE — Progress Notes (Signed)
Urine drug screen for this encounter is consistent for reported medication (last dose  Hydrocodone 01/10/15, clonazepam 01/07/15) test date 01/11/15

## 2015-02-09 ENCOUNTER — Ambulatory Visit: Payer: Self-pay | Admitting: Physical Medicine & Rehabilitation

## 2015-02-16 ENCOUNTER — Ambulatory Visit: Payer: Worker's Compensation | Admitting: Physical Medicine & Rehabilitation

## 2015-03-11 ENCOUNTER — Ambulatory Visit (HOSPITAL_BASED_OUTPATIENT_CLINIC_OR_DEPARTMENT_OTHER): Payer: Worker's Compensation | Admitting: Physical Medicine & Rehabilitation

## 2015-03-11 ENCOUNTER — Encounter: Payer: Self-pay | Admitting: Physical Medicine & Rehabilitation

## 2015-03-11 ENCOUNTER — Encounter: Payer: Worker's Compensation | Attending: Physical Medicine & Rehabilitation

## 2015-03-11 VITALS — BP 121/64 | HR 62 | Resp 14

## 2015-03-11 DIAGNOSIS — M961 Postlaminectomy syndrome, not elsewhere classified: Secondary | ICD-10-CM | POA: Diagnosis not present

## 2015-03-11 DIAGNOSIS — G5603 Carpal tunnel syndrome, bilateral upper limbs: Secondary | ICD-10-CM | POA: Diagnosis not present

## 2015-03-11 DIAGNOSIS — Z5181 Encounter for therapeutic drug level monitoring: Secondary | ICD-10-CM | POA: Insufficient documentation

## 2015-03-11 DIAGNOSIS — G894 Chronic pain syndrome: Secondary | ICD-10-CM | POA: Diagnosis present

## 2015-03-11 DIAGNOSIS — Z79899 Other long term (current) drug therapy: Secondary | ICD-10-CM | POA: Insufficient documentation

## 2015-03-11 DIAGNOSIS — G5613 Other lesions of median nerve, bilateral upper limbs: Secondary | ICD-10-CM

## 2015-03-11 DIAGNOSIS — R202 Paresthesia of skin: Secondary | ICD-10-CM | POA: Diagnosis not present

## 2015-03-11 MED ORDER — HYDROCODONE-ACETAMINOPHEN 5-325 MG PO TABS: 1.0000 | ORAL_TABLET | Freq: Three times a day (TID) | ORAL | Status: DC | PRN

## 2015-03-11 MED ORDER — METHYLPREDNISOLONE 4 MG PO TBPK: ORAL_TABLET | ORAL | Status: DC

## 2015-03-11 NOTE — Patient Instructions (Signed)
Bupropion; Naltrexone extended-release tablets What is this medicine?   BUPROPION; NALTREXONE (byoo PROE pee on; nal TREX one) is a combination product used to promote and maintain weight loss in obese adults or overweight adults who also have weight related medical problems. This medicine should be used with a reduced calorie diet and increased physical activity. This medicine may be used for other purposes; ask your health care provider or pharmacist if you have questions. What should I tell my health care provider before I take this medicine? They need to know if you have any of these conditions: -an eating disorder, such as anorexia or bulimia -diabetes -glaucoma -head injury -heart disease -high blood pressure -history of a drug or alcohol abuse problem -history of a tumor or infection of your brain or spine -history of stroke -history of irregular heartbeat -kidney disease -liver disease -mental illness such as bipolar disorder or psychosis -seizures -suicidal thoughts, plans, or attempt; a previous suicide attempt by you or a family member -an unusual or allergic reaction to bupropion, naltrexone, other medicines, foods, dyes, or preservatives breast-feeding -pregnant or trying to become pregnant How should I use this medicine? Take this medicine by mouth with a glass of water. Follow the directions on the prescription label. Take this medicine in the morning and in the evenings as directed by your healthcare professional. [name] can take it with or without food. Do not take with high-fat meals as this may increase your risk of seizures. Do not crush, chew, or cut these tablets. Do not take your medicine more often than directed. Do not stop taking this medicine suddenly except upon the advice of your doctor. A special MedGuide will be given to you by the pharmacist with each prescription and refill. Be sure to read this information carefully each time. Talk to your pediatrician  regarding the use of this medicine in children. Special care may be needed. Overdosage: If you think you have taken too much of this medicine contact a poison control center or emergency room at once. NOTE: This medicine is only for you. Do not share this medicine with others. What if I miss a dose? If you miss a dose, skip the missed dose and take your next tablet at the regular time. Do not take double or extra doses. What may interact with this medicine? Do not take this medicine with any of the following medications: -Pain medicines such as codeine, hydrocodone -linezolid -MAOIs like Carbex, Eldepryl, Marplan, Nardil, and Parnate -methylene blue (injected into a vein) -other medicines that contain bupropion like Zyban or Wellbutrin This medicine may also interact with the following medications: -alcohol -certain medicines for anxiety or sleep -certain medicines for blood pressure like metoprolol, propranolol -certain medicines for depression or psychotic disturbances -certain medicines for HIV or AIDS like efavirenz, lopinavir, nelfinavir, ritonavir -certain medicines for irregular heart beat like propafenone, flecainide -certain medicines for Parkinson's disease like amantadine, levodopa -certain medicines for seizures like carbamazepine, phenytoin, phenobarbital -cimetidine -clopidogrel -cyclophosphamide -disulfiram -furazolidone -isoniazid -nicotine -orphenadrine -procarbazine -steroid medicines like prednisone or cortisone -stimulant medicines for attention disorders, weight loss, or to stay awake -tamoxifen -theophylline -thioridazine -thiotepa -ticlopidine -tramadol -warfarin This list may not describe all possible interactions. Give your health care provider a list of all the medicines, herbs, non-prescription drugs, or dietary supplements you use. Also tell them if you smoke, drink alcohol, or use illegal drugs. Some items may interact with your medicine. What  should I watch for while using this medicine? This medicine is  intended to be used in addition to a healthy diet and appropriate exercise. The best results are achieved this way. Do not increase or in any way change your dose without consulting your doctor or health care professional. Do not take this medicine with other prescription or over-the-counter weight loss products without consulting your doctor or health care professional. Your doctor should tell you to stop taking this medicine if you do not lose a certain amount of weight within the first 12 weeks of treatment. Visit your doctor or health care professional for regular checkups. Your doctor may order blood tests or other tests to see how you are doing. This medicine may affect blood sugar levels. If you have diabetes, check with your doctor or health care professional before you change your diet or the dose of your diabetic medicine. Patients and their families should watch out for new or worsening depression or thoughts of suicide. Also watch out for sudden changes in feelings such as feeling anxious, agitated, panicky, irritable, hostile, aggressive, impulsive, severely restless, overly excited and hyperactive, or not being able to sleep. If this happens, especially at the beginning of treatment or after a change in dose, call your health care professional. Avoid alcoholic drinks while taking this medicine. Drinking large amounts of alcoholic beverages, using sleeping or anxiety medicines, or quickly stopping the use of these agents while taking this medicine may increase your risk for a seizure. What side effects may I notice from receiving this medicine? Side effects that you should report to your doctor or health care professional as soon as possible: -allergic reactions like skin rash, itching or hives, swelling of the face, lips, or tongue -breathing problems -changes in vision, hearing -chest pain -confusion -dark urine -depressed  mood -fast or irregular heart beat -fever -hallucination, loss of contact with reality -increased blood pressure -light-colored stools -redness, blistering, peeling or loosening of the skin, including inside the mouth -right upper belly pain -seizures -suicidal thoughts or other mood changes -unusually weak or tired -vomiting -yellowing of the eyes or skin Side effects that usually do not require medical attention (Report these to your doctor or health care professional if they continue or are bothersome.): -constipation -diarrhea -dizziness -dry mouth -headache -nausea -trouble sleeping This list may not describe all possible side effects. Call your doctor for medical advice about side effects. You may report side effects to FDA at 1-800-FDA-1088. Where should I keep my medicine? Keep out of the reach of children. Store at room temperature between 15 and 30 degrees C (59 and 86 degrees F). Throw away any unused medicine after the expiration date. NOTE: This sheet is a summary. It may not cover all possible information. If you have questions about this medicine, talk to your doctor, pharmacist, or health care provider.    2016, [employer]vier/Gold Standard. (2012-10-30 15:17:29)

## 2015-03-11 NOTE — Progress Notes (Signed)
Subjective:    Patient ID: [name], male    DOB: 08/13/1970, 45 y.o.   MRN: [id number]  HPI 45 year old male with history of lumbar pain and radiculopathy. He's undergone one laminectomy and 3 fusions.Last surgery 12/14/2011. Has been using walkers and canes after surgery. Has developed increasing pain and numbness in both hands. 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 Patient is more symptomatic on the left side however. He is awaiting wrist splints,borrowed one   on the left side from his wife which seems to help a little bit.   Patient has some constipation on his hydrocodone has had problems with this for a while, tried fleets enema as well as magnesium citrate solution without much effect.  Patient also complaining of shooting pain down both legs, this goes down to the foot starts in the thighs. No recent trauma.Usually gets good relief with the gabapentin But not consistently Has not tried corticosteroids.  Patient also asking about bupropion/naltrexone tablets and whether this is something he could take for weight loss. We discussed that the naltrexone would interfere with the analgesic effect of opioids Pain Inventory Average Pain 8 Pain Right Now 8 My pain is sharp, burning, dull, stabbing, tingling and aching  In the last 24 hours, has pain interfered with the following? General activity NA Relation with others NA Enjoyment of life NA What TIME of day is your pain at its worst? NA Sleep (in general) NA  Pain is worse with: walking, bending, sitting and standing Pain improves with: rest and medication Relief from Meds: NA  Mobility walk without assistance walk with assistance how many minutes can you walk? 15 ability to climb steps?  yes do you drive?  yes  Function employed # of hrs/week NA disabled: date disabled  NA  Neuro/Psych weakness numbness tremor tingling trouble walking spasms depression  Prior Studies x-rays CT/MRI nerve study  Physicians involved in your care Any changes since last visit?  no   Family History  Problem Relation Age of Onset  . Coronary artery disease    . Hypertension Mother   . Hyperlipidemia Father   . Heart disease Father    Social History   Social History  . Marital Status: Married    Spouse Name: N/A  . Number of Children: N/A  . Years of Education: N/A   Social History Main Topics  . Smoking status: Never Smoker   . Smokeless tobacco: None  . Alcohol Use: No  . Drug Use: No  . Sexual Activity: Yes   Other Topics Concern  . None   Social History Narrative   Past Surgical History  Procedure Laterality Date  . Cholecystectomy    . Spinal fusion      l4-5 then l3-5 revision with failure  . Lumbar fusion  L45 the L-S1 revision    spinal leak complication  . Lumbar laminectomy/decompression microdiscectomy  12/14/2011    Procedure: LUMBAR LAMINECTOMY/DECOMPRESSION MICRODISCECTOMY 1 LEVEL;  Surgeon: Reinaldo Meeker, MD;  Location: [facility];  Service: Neurosurgery;  Laterality: Left;  Left Lumbar Five-Sacral One Decompressive Foraminotomy   Past Medical History  Diagnosis Date  . Allergy   . Asthma   . Sinusitis, chronic   . Apnea   . Restless leg   . Spinal fusion failure (HCC)     spinal fluid leak  . Pneumonia     has had  6-7 times  . Hypoglycemia   . Alpha-1-antitrypsin deficiency (HCC)   . Migraines   . Sleep apnea     records on chart, severe, stops breathing 100+ times per night, uses cpap setting 18, warm air machine   BP 121/64 mmHg  Pulse 62  Resp 14  SpO2 96%  Opioid Risk Score:   Fall Risk Score:  `1  Depression screen PHQ 2/9  Depression screen [facility] 2/9 08/26/2014 06/19/2014  Decreased Interest 2 3  Down, Depressed, Hopeless 3 2  PHQ - 2 Score 5 5  Altered sleeping 2 1  Tired, decreased energy 3 3   Change in appetite 3 3  Feeling bad or failure about yourself  2 1  Trouble concentrating 2 1  Moving slowly or fidgety/restless 3 3  Suicidal thoughts 1 1  PHQ-9 Score 21 18  Difficult doing work/chores Somewhat difficult -     Review of Systems  Constitutional: Positive for unexpected weight change.  Musculoskeletal: Positive for gait problem.  Neurological: Positive for tremors, weakness and numbness.       Tingling Spasms   Psychiatric/Behavioral: Positive for dysphoric mood.  All other systems reviewed and are negative.      Objective:   Physical Exam Left ankle dorsiflexor weakness 3 minus, Right hip flexor and knee extensor ankle dorsal flexor 5/5 Left hip flexor and knee extensor 5/5  Sensation reduced over the left and right index finger compared to the fifth digit. No hand atrophy. Her strength in the approximate 5/5 deltoid bicep tricep grip         Assessment & Plan:  1. Lumbar postlaminectomy syndrome with chronic low back as well as lower extremity pain. Some increasing radicular discomfort. This is despite gabapentin. Will continue current medications but will add a Medrol Dosepak  2. 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   Advised wrist splints bilateral at night. Medrol should help with this condition as well. If no better in 1 month consider carpal tunnel injection on the left side since this most symptomatic side  3. Opioid-induced constipation. He will go every 2-3 days. This is when he takes his full dose of hydrocodone. Will monitor this, continue encouraging fiber and liquid intake . Consider Movantik if this worsens

## 2015-03-12 ENCOUNTER — Other Ambulatory Visit: Payer: Self-pay | Admitting: Physical Medicine & Rehabilitation

## 2015-04-09 ENCOUNTER — Ambulatory Visit: Payer: BLUE CROSS/BLUE SHIELD | Admitting: Physical Medicine & Rehabilitation

## 2015-04-12 ENCOUNTER — Ambulatory Visit (HOSPITAL_BASED_OUTPATIENT_CLINIC_OR_DEPARTMENT_OTHER): Payer: BLUE CROSS/BLUE SHIELD | Admitting: Physical Medicine & Rehabilitation

## 2015-04-12 ENCOUNTER — Encounter: Payer: BLUE CROSS/BLUE SHIELD | Attending: Physical Medicine & Rehabilitation

## 2015-04-12 ENCOUNTER — Encounter: Payer: Self-pay | Admitting: Physical Medicine & Rehabilitation

## 2015-04-12 VITALS — BP 120/71 | HR 55 | Resp 14

## 2015-04-12 DIAGNOSIS — M25532 Pain in left wrist: Secondary | ICD-10-CM | POA: Insufficient documentation

## 2015-04-12 DIAGNOSIS — M25531 Pain in right wrist: Secondary | ICD-10-CM | POA: Diagnosis present

## 2015-04-12 DIAGNOSIS — G5613 Other lesions of median nerve, bilateral upper limbs: Secondary | ICD-10-CM | POA: Diagnosis not present

## 2015-04-12 DIAGNOSIS — G5603 Carpal tunnel syndrome, bilateral upper limbs: Secondary | ICD-10-CM | POA: Diagnosis not present

## 2015-04-12 MED ORDER — HYDROCODONE-ACETAMINOPHEN 5-325 MG PO TABS: 1.0000 | ORAL_TABLET | Freq: Three times a day (TID) | ORAL | Status: DC | PRN

## 2015-04-12 NOTE — Patient Instructions (Signed)

## 2015-04-12 NOTE — Progress Notes (Signed)
Carpal tunnel injection Bilateral  Indication: Median neuropathy at the wrist documented by EMG or ultrasound and interfering with sleep and other functional activities. Symptoms are not relieved by conservative care.  Informed consent was obtained after describing risks and benefits of the procedure with the patient. These include bleeding bruising and infection as well as nerve injury. Patient elected to proceed and has given written consent. The distal wrist crease was marked and prepped with Betadine. A 30-gauge 1/2 inch needle was inserted and 0.25 ML's of 1% lidocaine injected into the skin and subcutaneous tissue. Then a 27-gauge 1/2 inch needle was inserted into the carpal tunnel and 0.25 mL of Celestone 6mg  per mL was injected. Patient tolerated procedure well. Post procedure instructions given.His procedure was first done on the right side and then on the left side

## 2015-05-03 ENCOUNTER — Other Ambulatory Visit: Payer: Self-pay | Admitting: Physical Medicine & Rehabilitation

## 2015-05-10 ENCOUNTER — Ambulatory Visit: Payer: BLUE CROSS/BLUE SHIELD | Admitting: Physical Medicine & Rehabilitation

## 2015-05-11 ENCOUNTER — Encounter: Payer: Self-pay | Admitting: Physical Medicine & Rehabilitation

## 2015-05-12 ENCOUNTER — Encounter: Payer: Self-pay | Admitting: Physical Medicine & Rehabilitation

## 2015-05-25 ENCOUNTER — Ambulatory Visit: Payer: BLUE CROSS/BLUE SHIELD | Admitting: Physical Medicine & Rehabilitation

## 2015-05-25 ENCOUNTER — Ambulatory Visit: Payer: Worker's Compensation

## 2015-06-01 ENCOUNTER — Ambulatory Visit: Payer: Worker's Compensation | Admitting: Physical Medicine & Rehabilitation

## 2015-06-01 ENCOUNTER — Encounter: Payer: Worker's Compensation | Attending: Physical Medicine & Rehabilitation

## 2015-06-01 ENCOUNTER — Encounter: Payer: Self-pay | Admitting: Physical Medicine & Rehabilitation

## 2015-06-01 DIAGNOSIS — G5603 Carpal tunnel syndrome, bilateral upper limbs: Secondary | ICD-10-CM | POA: Insufficient documentation

## 2015-06-01 DIAGNOSIS — Z5181 Encounter for therapeutic drug level monitoring: Secondary | ICD-10-CM | POA: Insufficient documentation

## 2015-06-01 DIAGNOSIS — Z79899 Other long term (current) drug therapy: Secondary | ICD-10-CM | POA: Insufficient documentation

## 2015-06-01 DIAGNOSIS — R202 Paresthesia of skin: Secondary | ICD-10-CM | POA: Insufficient documentation

## 2015-06-01 DIAGNOSIS — G894 Chronic pain syndrome: Secondary | ICD-10-CM | POA: Insufficient documentation

## 2015-06-03 ENCOUNTER — Other Ambulatory Visit: Payer: Self-pay | Admitting: *Deleted

## 2015-06-03 MED ORDER — HYDROCODONE-ACETAMINOPHEN 5-325 MG PO TABS: 1.0000 | ORAL_TABLET | Freq: Three times a day (TID) | ORAL | Status: DC | PRN

## 2015-06-03 NOTE — Telephone Encounter (Signed)
Final prescription from our office printed.

## 2015-06-15 IMAGING — CR DG ABDOMEN 1V
4 series · 4 of 4 positions shown · non-contrast
Comparison: CT scan of October 12, 2010.

CLINICAL DATA: Mid abdominal pain for 2 weeks.

EXAM:
ABDOMEN - 1 VIEW

[t abdomen supine (1 of 4)]
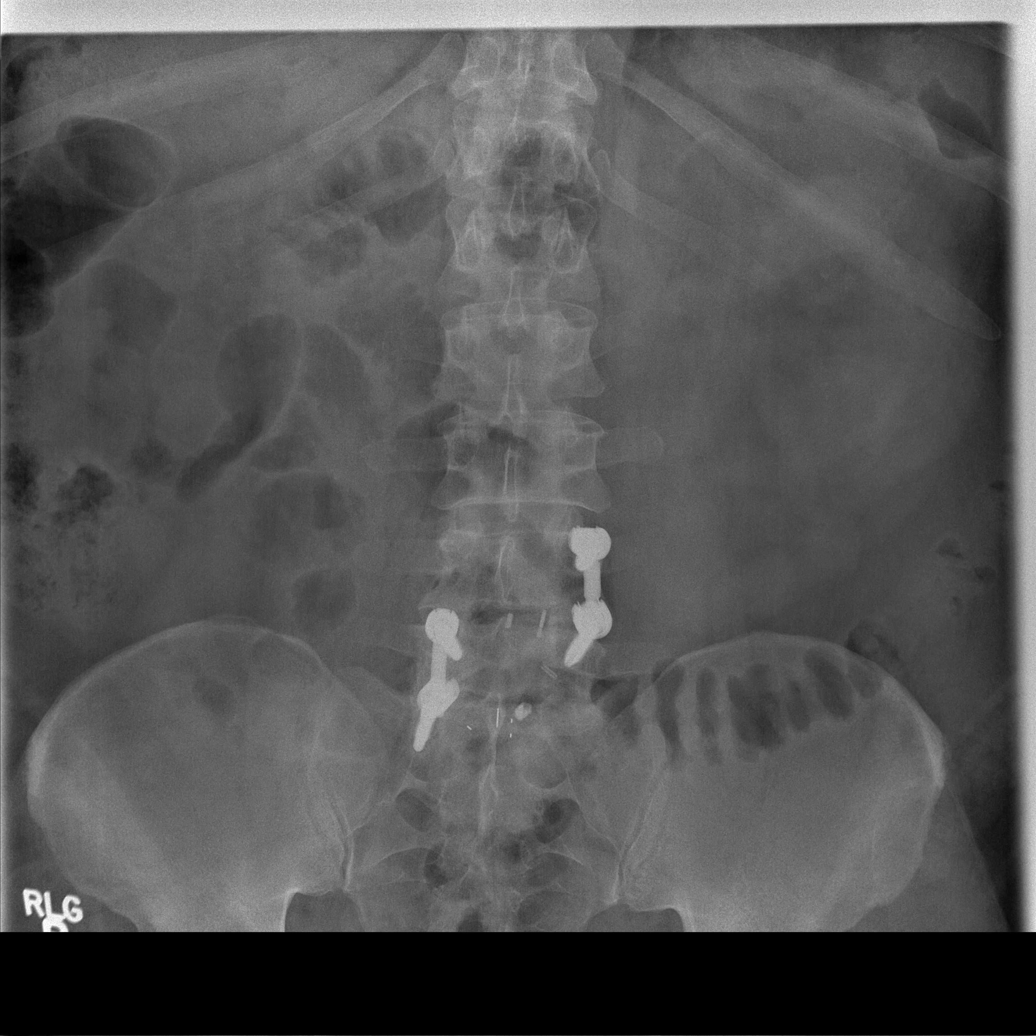

[t abdomen supine (2 of 4)]
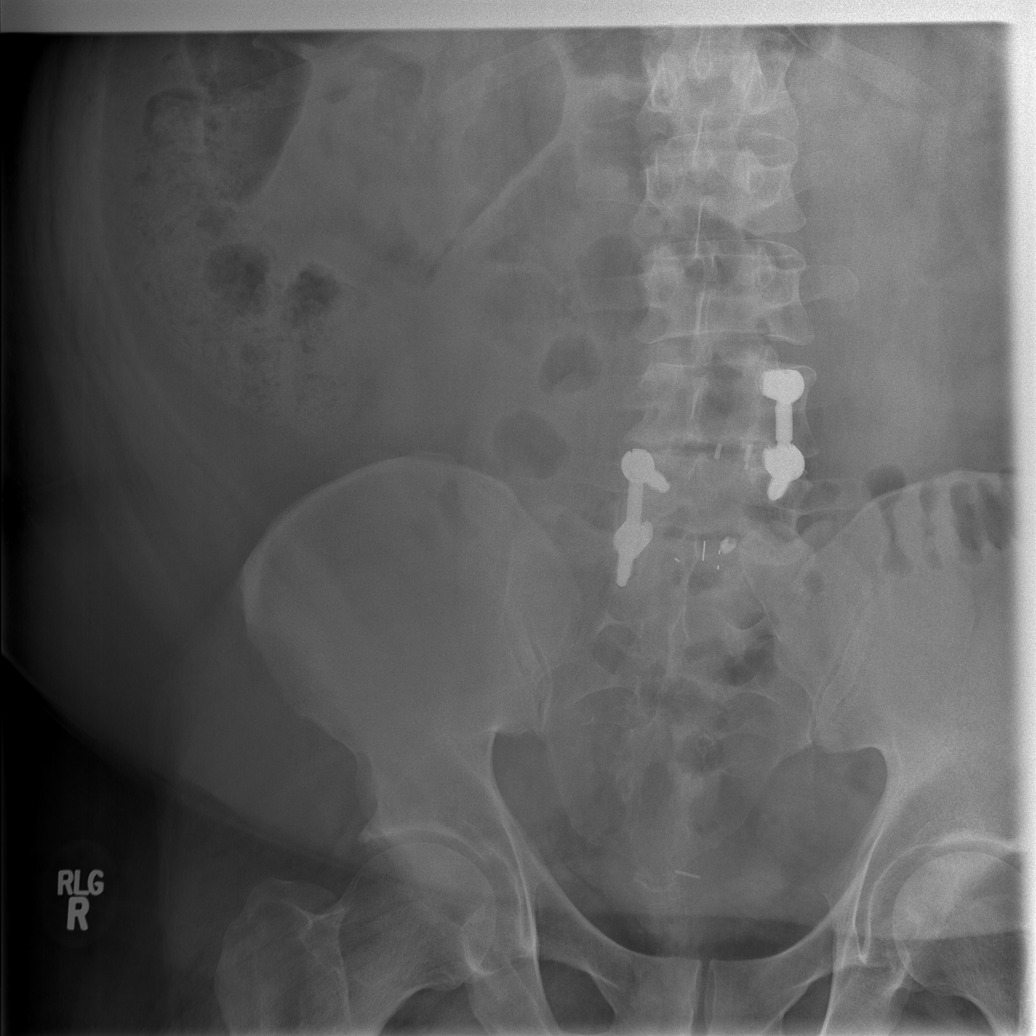

[t abdomen supine (3 of 4)]
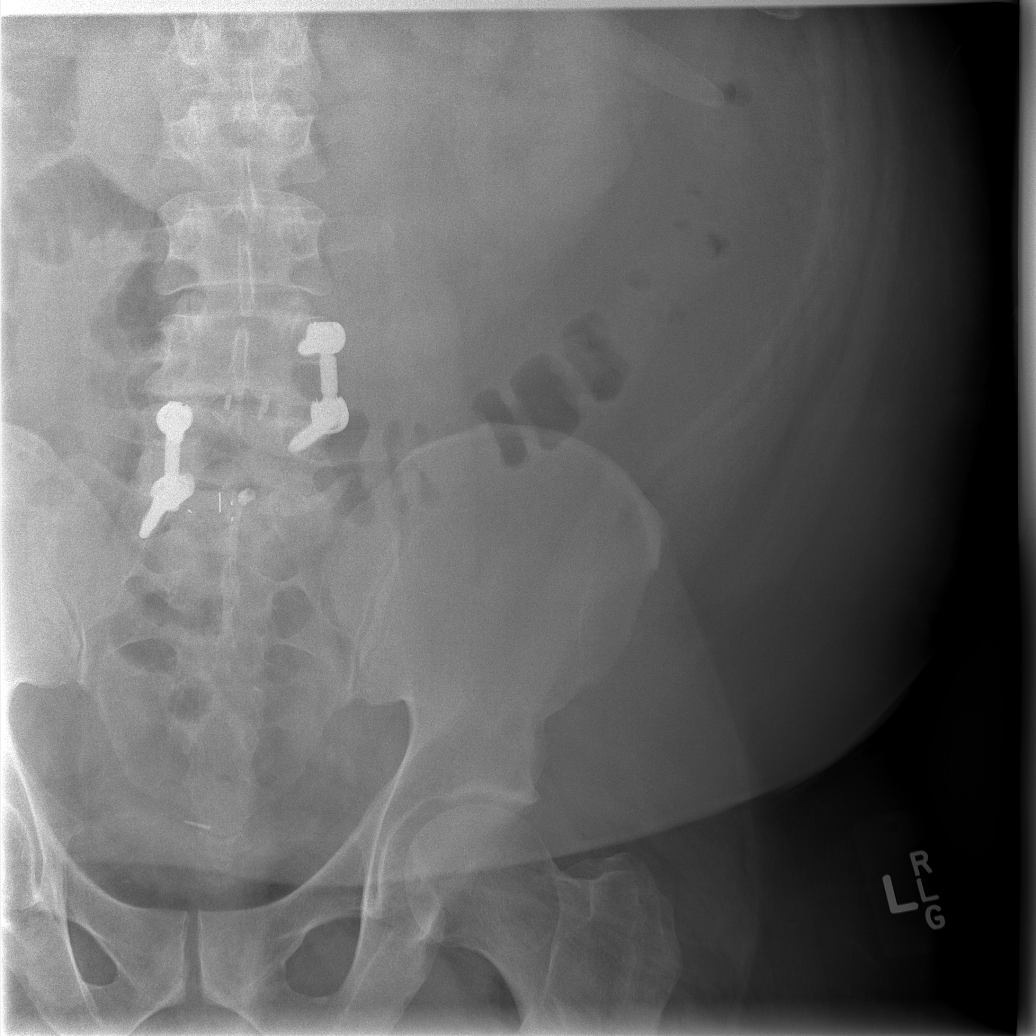

[t abdomen supine (4 of 4)]
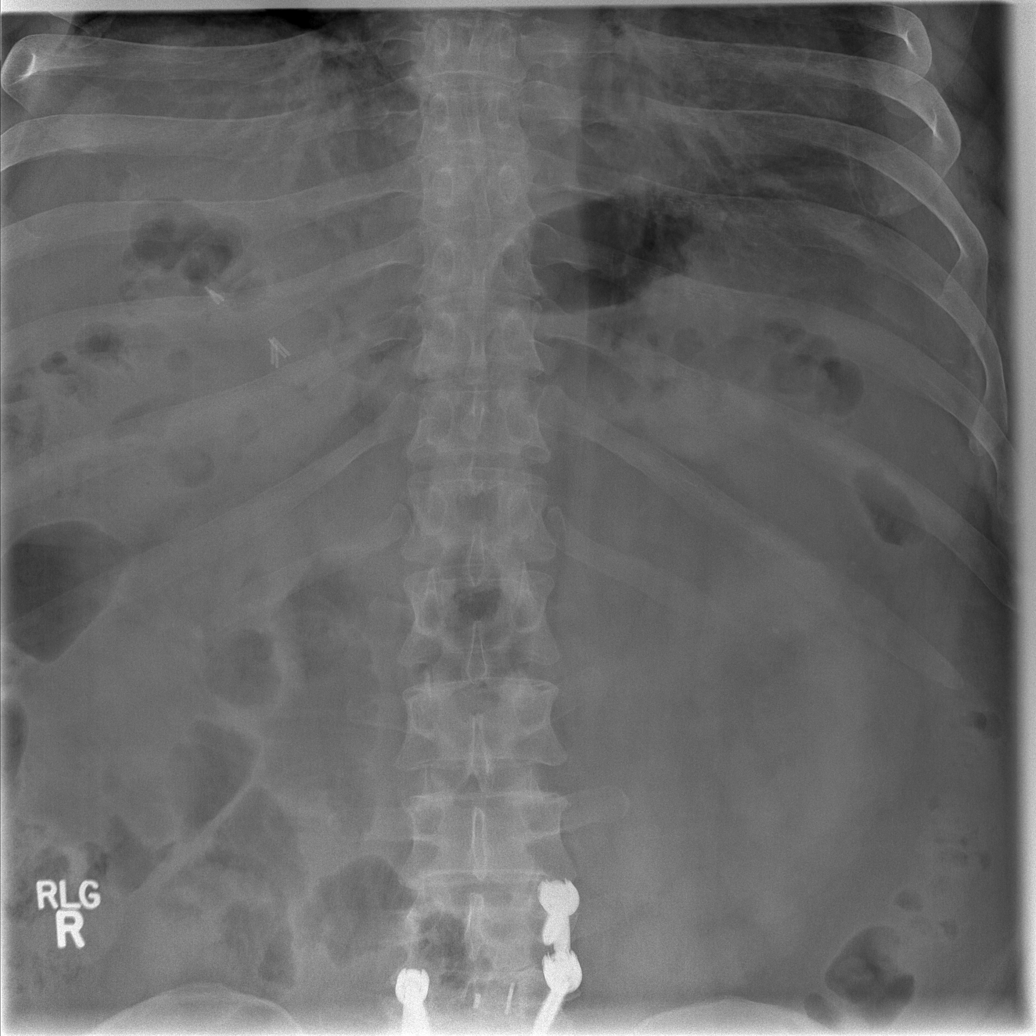

[4 of 4 positions shown; findings below may reference images not displayed]

FINDINGS: The bowel gas pattern is normal. Status post cholecystectomy. Status
post surgical fusion of lower lumbar spine. No abnormal
calcifications are noted.
IMPRESSION: No evidence of bowel obstruction or ileus.

## 2015-11-09 DIAGNOSIS — M1712 Unilateral primary osteoarthritis, left knee: Secondary | ICD-10-CM | POA: Diagnosis not present

## 2015-11-09 DIAGNOSIS — M25562 Pain in left knee: Secondary | ICD-10-CM | POA: Diagnosis not present

## 2015-11-11 ENCOUNTER — Other Ambulatory Visit: Payer: Self-pay | Admitting: Orthopedic Surgery

## 2015-11-11 DIAGNOSIS — M25562 Pain in left knee: Secondary | ICD-10-CM

## 2015-11-22 ENCOUNTER — Ambulatory Visit
Admission: RE | Admit: 2015-11-22 | Discharge: 2015-11-22 | Disposition: A | Discharge status: Home / Self Care | Payer: BLUE CROSS/BLUE SHIELD | Source: Ambulatory Visit | Attending: Orthopedic Surgery | Admitting: Orthopedic Surgery

## 2015-11-22 DIAGNOSIS — M25562 Pain in left knee: Secondary | ICD-10-CM | POA: Diagnosis not present

## 2015-11-24 DIAGNOSIS — G5603 Carpal tunnel syndrome, bilateral upper limbs: Secondary | ICD-10-CM | POA: Diagnosis not present

## 2015-12-07 DIAGNOSIS — S83412A Sprain of medial collateral ligament of left knee, initial encounter: Secondary | ICD-10-CM | POA: Diagnosis not present

## 2015-12-07 DIAGNOSIS — M6752 Plica syndrome, left knee: Secondary | ICD-10-CM | POA: Diagnosis not present

## 2015-12-07 DIAGNOSIS — S82115A Nondisplaced fracture of left tibial spine, initial encounter for closed fracture: Secondary | ICD-10-CM | POA: Diagnosis not present

## 2015-12-08 DIAGNOSIS — M25562 Pain in left knee: Secondary | ICD-10-CM | POA: Diagnosis not present

## 2015-12-08 DIAGNOSIS — G5603 Carpal tunnel syndrome, bilateral upper limbs: Secondary | ICD-10-CM | POA: Diagnosis not present

## 2015-12-20 DIAGNOSIS — G5603 Carpal tunnel syndrome, bilateral upper limbs: Secondary | ICD-10-CM | POA: Diagnosis not present

## 2016-02-16 ENCOUNTER — Other Ambulatory Visit: Payer: Self-pay | Admitting: Orthopedic Surgery

## 2016-02-16 DIAGNOSIS — G5603 Carpal tunnel syndrome, bilateral upper limbs: Secondary | ICD-10-CM | POA: Diagnosis not present

## 2016-02-24 ENCOUNTER — Ambulatory Visit (HOSPITAL_BASED_OUTPATIENT_CLINIC_OR_DEPARTMENT_OTHER): Admit: 2016-02-24 | Payer: BLUE CROSS/BLUE SHIELD | Admitting: Orthopedic Surgery

## 2016-02-24 ENCOUNTER — Encounter (HOSPITAL_BASED_OUTPATIENT_CLINIC_OR_DEPARTMENT_OTHER): Payer: Self-pay

## 2016-02-24 SURGERY — CARPAL TUNNEL RELEASE
Anesthesia: Regional | Laterality: Left

## 2016-03-30 DIAGNOSIS — M722 Plantar fascial fibromatosis: Secondary | ICD-10-CM | POA: Diagnosis not present

## 2016-03-30 DIAGNOSIS — M71571 Other bursitis, not elsewhere classified, right ankle and foot: Secondary | ICD-10-CM | POA: Diagnosis not present

## 2016-03-30 DIAGNOSIS — M19071 Primary osteoarthritis, right ankle and foot: Secondary | ICD-10-CM | POA: Diagnosis not present

## 2016-03-30 DIAGNOSIS — M19072 Primary osteoarthritis, left ankle and foot: Secondary | ICD-10-CM | POA: Diagnosis not present

## 2016-03-30 DIAGNOSIS — M67372 Transient synovitis, left ankle and foot: Secondary | ICD-10-CM | POA: Diagnosis not present

## 2016-03-30 DIAGNOSIS — M7732 Calcaneal spur, left foot: Secondary | ICD-10-CM | POA: Diagnosis not present

## 2016-03-30 DIAGNOSIS — M71572 Other bursitis, not elsewhere classified, left ankle and foot: Secondary | ICD-10-CM | POA: Diagnosis not present

## 2016-03-30 DIAGNOSIS — M7731 Calcaneal spur, right foot: Secondary | ICD-10-CM | POA: Diagnosis not present

## 2016-03-30 DIAGNOSIS — M67371 Transient synovitis, right ankle and foot: Secondary | ICD-10-CM | POA: Diagnosis not present

## 2016-03-30 DIAGNOSIS — M7661 Achilles tendinitis, right leg: Secondary | ICD-10-CM | POA: Diagnosis not present

## 2016-04-04 DIAGNOSIS — M1712 Unilateral primary osteoarthritis, left knee: Secondary | ICD-10-CM | POA: Diagnosis not present

## 2016-04-04 DIAGNOSIS — M6752 Plica syndrome, left knee: Secondary | ICD-10-CM | POA: Diagnosis not present

## 2016-04-06 DIAGNOSIS — M71571 Other bursitis, not elsewhere classified, right ankle and foot: Secondary | ICD-10-CM | POA: Diagnosis not present

## 2016-04-06 DIAGNOSIS — M76822 Posterior tibial tendinitis, left leg: Secondary | ICD-10-CM | POA: Diagnosis not present

## 2016-04-06 DIAGNOSIS — M722 Plantar fascial fibromatosis: Secondary | ICD-10-CM | POA: Diagnosis not present

## 2016-04-06 DIAGNOSIS — M76821 Posterior tibial tendinitis, right leg: Secondary | ICD-10-CM | POA: Diagnosis not present

## 2016-04-06 DIAGNOSIS — M71572 Other bursitis, not elsewhere classified, left ankle and foot: Secondary | ICD-10-CM | POA: Diagnosis not present

## 2016-04-06 DIAGNOSIS — M67372 Transient synovitis, left ankle and foot: Secondary | ICD-10-CM | POA: Diagnosis not present

## 2016-04-06 DIAGNOSIS — M67371 Transient synovitis, right ankle and foot: Secondary | ICD-10-CM | POA: Diagnosis not present

## 2016-04-18 DIAGNOSIS — M67371 Transient synovitis, right ankle and foot: Secondary | ICD-10-CM | POA: Diagnosis not present

## 2016-04-18 DIAGNOSIS — M71572 Other bursitis, not elsewhere classified, left ankle and foot: Secondary | ICD-10-CM | POA: Diagnosis not present

## 2016-04-18 DIAGNOSIS — M67372 Transient synovitis, left ankle and foot: Secondary | ICD-10-CM | POA: Diagnosis not present

## 2016-04-18 DIAGNOSIS — M71571 Other bursitis, not elsewhere classified, right ankle and foot: Secondary | ICD-10-CM | POA: Diagnosis not present

## 2016-04-18 DIAGNOSIS — M722 Plantar fascial fibromatosis: Secondary | ICD-10-CM | POA: Diagnosis not present

## 2016-04-25 ENCOUNTER — Encounter (HOSPITAL_BASED_OUTPATIENT_CLINIC_OR_DEPARTMENT_OTHER): Payer: Self-pay | Admitting: *Deleted

## 2016-04-25 NOTE — Progress Notes (Signed)
   04/25/16 1155  OBSTRUCTIVE SLEEP APNEA  Have you ever been diagnosed with sleep apnea through a sleep study? Yes  If yes, do you have and use a CPAP or BPAP machine every night? 1  Do you know the presssure settings on your maching? Yes  Do you snore loudly (loud enough to be heard through closed doors)?  0  Do you often feel tired, fatigued, or sleepy during the daytime (such as falling asleep during driving or talking to someone)? 1  Has anyone observed you stop breathing during your sleep? 1  Do you have, or are you being treated for high blood pressure? 0  BMI more than 35 kg/m2? 1  Age > 50 (1-yes) 0  Neck circumference greater than:Male 16 inches or larger, Male 17inches or larger? 0  Male Gender (Yes=1) 1  Obstructive Sleep Apnea Score 4

## 2016-04-27 ENCOUNTER — Encounter (HOSPITAL_BASED_OUTPATIENT_CLINIC_OR_DEPARTMENT_OTHER): Payer: Self-pay | Admitting: Anesthesiology

## 2016-04-27 NOTE — Anesthesia Preprocedure Evaluation (Addendum)
Anesthesia Evaluation  Patient identified by MRN, date of birth, ID band Patient awake    Reviewed: Allergy & Precautions, NPO status , Patient's Chart, lab work & pertinent test results  Airway Mallampati: II       Dental no notable dental hx.    Pulmonary    Pulmonary exam normal        Cardiovascular negative cardio ROS Normal cardiovascular exam     Neuro/Psych    GI/Hepatic negative GI ROS, Neg liver ROS,   Endo/Other  Morbid obesity  Renal/GU negative Renal ROS  negative genitourinary   Musculoskeletal   Abdominal (+) + obese,   Peds  Hematology negative hematology ROS (+)   Anesthesia Other Findings   Reproductive/Obstetrics                            Anesthesia Physical Anesthesia Plan  ASA: III  Anesthesia Plan: MAC and Bier Block   Post-op Pain Management:    Induction:   Airway Management Planned: Natural Airway and Simple Face Mask  Additional Equipment:   Intra-op Plan:   Post-operative Plan:   Informed Consent: I have reviewed the patients History and Physical, chart, labs and discussed the procedure including the risks, benefits and alternatives for the proposed anesthesia with the patient or authorized representative who has indicated his/her understanding and acceptance.     Plan Discussed with: CRNA and Surgeon  Anesthesia Plan Comments:        Anesthesia Quick Evaluation

## 2016-04-28 ENCOUNTER — Ambulatory Visit (HOSPITAL_BASED_OUTPATIENT_CLINIC_OR_DEPARTMENT_OTHER)
Admission: RE | Admit: 2016-04-28 | Discharge: 2016-04-28 | Disposition: A | Discharge status: Home / Self Care | Payer: Worker's Compensation | Source: Ambulatory Visit | Attending: Orthopedic Surgery | Admitting: Orthopedic Surgery

## 2016-04-28 ENCOUNTER — Encounter (HOSPITAL_BASED_OUTPATIENT_CLINIC_OR_DEPARTMENT_OTHER): Payer: Self-pay

## 2016-04-28 ENCOUNTER — Ambulatory Visit (HOSPITAL_BASED_OUTPATIENT_CLINIC_OR_DEPARTMENT_OTHER): Payer: Worker's Compensation | Admitting: Anesthesiology

## 2016-04-28 ENCOUNTER — Encounter (HOSPITAL_BASED_OUTPATIENT_CLINIC_OR_DEPARTMENT_OTHER)
Admission: RE | Disposition: A | Discharge status: Home / Self Care | Payer: Self-pay | Source: Ambulatory Visit | Attending: Orthopedic Surgery

## 2016-04-28 DIAGNOSIS — G5602 Carpal tunnel syndrome, left upper limb: Secondary | ICD-10-CM | POA: Diagnosis not present

## 2016-04-28 DIAGNOSIS — Z6841 Body Mass Index (BMI) 40.0 and over, adult: Secondary | ICD-10-CM | POA: Insufficient documentation

## 2016-04-28 DIAGNOSIS — E8801 Alpha-1-antitrypsin deficiency: Secondary | ICD-10-CM | POA: Insufficient documentation

## 2016-04-28 DIAGNOSIS — J45909 Unspecified asthma, uncomplicated: Secondary | ICD-10-CM | POA: Diagnosis not present

## 2016-04-28 DIAGNOSIS — F329 Major depressive disorder, single episode, unspecified: Secondary | ICD-10-CM | POA: Insufficient documentation

## 2016-04-28 DIAGNOSIS — Z7951 Long term (current) use of inhaled steroids: Secondary | ICD-10-CM | POA: Diagnosis not present

## 2016-04-28 DIAGNOSIS — G4730 Sleep apnea, unspecified: Secondary | ICD-10-CM | POA: Insufficient documentation

## 2016-04-28 DIAGNOSIS — Z79899 Other long term (current) drug therapy: Secondary | ICD-10-CM | POA: Diagnosis not present

## 2016-04-28 HISTORY — PX: CARPAL TUNNEL RELEASE: SHX101

## 2016-04-28 HISTORY — DX: Other chronic pain: G89.29

## 2016-04-28 HISTORY — DX: Other complications of anesthesia, initial encounter: T88.59XA

## 2016-04-28 HISTORY — DX: Adverse effect of unspecified anesthetic, initial encounter: T41.45XA

## 2016-04-28 HISTORY — DX: Depression, unspecified: F32.A

## 2016-04-28 HISTORY — DX: Dorsalgia, unspecified: M54.9

## 2016-04-28 HISTORY — DX: Major depressive disorder, single episode, unspecified: F32.9

## 2016-04-28 SURGERY — CARPAL TUNNEL RELEASE
Anesthesia: Monitor Anesthesia Care | Site: Wrist | Laterality: Left

## 2016-04-28 MED ORDER — KETOROLAC TROMETHAMINE 30 MG/ML IJ SOLN: 30.0000 mg | Freq: Once | INTRAMUSCULAR | Status: DC

## 2016-04-28 MED ORDER — CEFAZOLIN SODIUM-DEXTROSE 2-4 GM/100ML-% IV SOLN
INTRAVENOUS | Status: AC
  Filled 2016-04-28: qty 100

## 2016-04-28 MED ORDER — OXYCODONE HCL 5 MG PO TABS: 10.0000 mg | ORAL_TABLET | ORAL | 0 refills | Status: AC | PRN

## 2016-04-28 MED ORDER — LACTATED RINGERS IV SOLN
INTRAVENOUS | Status: DC
  Administered 2016-04-28: 07:00:00 via INTRAVENOUS

## 2016-04-28 MED ORDER — BUPIVACAINE HCL (PF) 0.25 % IJ SOLN
INTRAMUSCULAR | Status: AC
  Filled 2016-04-28: qty 30

## 2016-04-28 MED ORDER — CEFAZOLIN SODIUM-DEXTROSE 2-3 GM-% IV SOLR
INTRAVENOUS | Status: DC | PRN
  Administered 2016-04-28: 2 g via INTRAVENOUS

## 2016-04-28 MED ORDER — MIDAZOLAM HCL 2 MG/2ML IJ SOLN
INTRAMUSCULAR | Status: AC
Start: 2016-04-28 — End: 2016-04-28
  Filled 2016-04-28: qty 2

## 2016-04-28 MED ORDER — LIDOCAINE HCL (PF) 1 % IJ SOLN
INTRAMUSCULAR | Status: AC
  Filled 2016-04-28: qty 30

## 2016-04-28 MED ORDER — MIDAZOLAM HCL 2 MG/2ML IJ SOLN
1.0000 mg | INTRAMUSCULAR | Status: DC | PRN
  Administered 2016-04-28: 1 mg via INTRAVENOUS

## 2016-04-28 MED ORDER — ONDANSETRON HCL 4 MG/2ML IJ SOLN
INTRAMUSCULAR | Status: DC | PRN
  Administered 2016-04-28: 4 mg via INTRAVENOUS

## 2016-04-28 MED ORDER — IBUPROFEN 100 MG/5ML PO SUSP: 200.0000 mg | Freq: Four times a day (QID) | ORAL | Status: DC | PRN

## 2016-04-28 MED ORDER — FENTANYL CITRATE (PF) 100 MCG/2ML IJ SOLN
INTRAMUSCULAR | Status: AC
  Filled 2016-04-28: qty 2

## 2016-04-28 MED ORDER — OXYCODONE HCL 5 MG/5ML PO SOLN: 5.0000 mg | Freq: Once | ORAL | Status: DC | PRN

## 2016-04-28 MED ORDER — SODIUM BICARBONATE 4 % IV SOLN
INTRAVENOUS | Status: DC | PRN
Start: 2016-04-28 — End: 2016-04-28
  Administered 2016-04-28: 20 mL via INTRAMUSCULAR

## 2016-04-28 MED ORDER — OXYCODONE HCL 5 MG PO TABS: 5.0000 mg | ORAL_TABLET | Freq: Once | ORAL | Status: DC | PRN

## 2016-04-28 MED ORDER — SODIUM BICARBONATE 4 % IV SOLN
INTRAVENOUS | Status: AC
  Filled 2016-04-28: qty 5

## 2016-04-28 MED ORDER — SCOPOLAMINE 1 MG/3DAYS TD PT72: 1.0000 | MEDICATED_PATCH | Freq: Once | TRANSDERMAL | Status: DC | PRN

## 2016-04-28 MED ORDER — IBUPROFEN 200 MG PO TABS: 200.0000 mg | ORAL_TABLET | Freq: Four times a day (QID) | ORAL | Status: DC | PRN

## 2016-04-28 MED ORDER — FENTANYL CITRATE (PF) 100 MCG/2ML IJ SOLN
50.0000 ug | INTRAMUSCULAR | Status: DC | PRN
  Administered 2016-04-28: 50 ug via INTRAVENOUS

## 2016-04-28 MED ORDER — ONDANSETRON HCL 4 MG/2ML IJ SOLN: 4.0000 mg | Freq: Once | INTRAMUSCULAR | Status: DC | PRN

## 2016-04-28 MED ORDER — FENTANYL CITRATE (PF) 100 MCG/2ML IJ SOLN: 25.0000 ug | INTRAMUSCULAR | Status: DC | PRN

## 2016-04-28 MED ORDER — MEPERIDINE HCL 25 MG/ML IJ SOLN: 6.2500 mg | INTRAMUSCULAR | Status: DC | PRN

## 2016-04-28 SURGICAL SUPPLY — 48 items
BANDAGE ACE 3X5.8 VEL STRL LF (GAUZE/BANDAGES/DRESSINGS) ×3 IMPLANT
BLADE CARPAL TUNNEL SNGL USE (BLADE) ×3 IMPLANT
BLADE SURG 15 STRL LF DISP TIS (BLADE) ×2 IMPLANT
BLADE SURG 15 STRL SS (BLADE) ×4
BNDG CONFORM 3 STRL LF (GAUZE/BANDAGES/DRESSINGS) ×3 IMPLANT
BRUSH SCRUB EZ PLAIN DRY (MISCELLANEOUS) ×3 IMPLANT
CORDS BIPOLAR (ELECTRODE) ×3 IMPLANT
COVER BACK TABLE 60X90IN (DRAPES) ×3 IMPLANT
CUFF TOURNIQUET SINGLE 18IN (TOURNIQUET CUFF) IMPLANT
DRAIN PENROSE 1/4X12 LTX STRL (WOUND CARE) IMPLANT
DRAPE EXTREMITY T 121X128X90 (DRAPE) ×3 IMPLANT
DRAPE SURG 17X23 STRL (DRAPES) ×3 IMPLANT
DRSG EMULSION OIL 3X3 NADH (GAUZE/BANDAGES/DRESSINGS) ×3 IMPLANT
GAUZE SPONGE 4X4 12PLY STRL (GAUZE/BANDAGES/DRESSINGS) IMPLANT
GAUZE SPONGE 4X4 16PLY XRAY LF (GAUZE/BANDAGES/DRESSINGS) IMPLANT
GAUZE XEROFORM 1X8 LF (GAUZE/BANDAGES/DRESSINGS) ×3 IMPLANT
GLOVE BIO SURGEON STRL SZ 6.5 (GLOVE) ×4 IMPLANT
GLOVE BIO SURGEONS STRL SZ 6.5 (GLOVE) ×2
GLOVE BIOGEL M STRL SZ7.5 (GLOVE) IMPLANT
GLOVE BIOGEL PI IND STRL 7.0 (GLOVE) ×3 IMPLANT
GLOVE BIOGEL PI INDICATOR 7.0 (GLOVE) ×6
GLOVE SS BIOGEL STRL SZ 8 (GLOVE) ×1 IMPLANT
GLOVE SUPERSENSE BIOGEL SZ 8 (GLOVE) ×2
GOWN STRL REUS W/ TWL LRG LVL3 (GOWN DISPOSABLE) ×2 IMPLANT
GOWN STRL REUS W/ TWL XL LVL3 (GOWN DISPOSABLE) ×1 IMPLANT
GOWN STRL REUS W/TWL LRG LVL3 (GOWN DISPOSABLE) ×4
GOWN STRL REUS W/TWL XL LVL3 (GOWN DISPOSABLE) ×2
LOOP VESSEL MAXI BLUE (MISCELLANEOUS) IMPLANT
NDL SAFETY ECLIPSE 18X1.5 (NEEDLE) ×1 IMPLANT
NEEDLE HYPO 18GX1.5 SHARP (NEEDLE) ×2
NEEDLE HYPO 22GX1.5 SAFETY (NEEDLE) IMPLANT
NEEDLE HYPO 25X1 1.5 SAFETY (NEEDLE) ×6 IMPLANT
NS IRRIG 1000ML POUR BTL (IV SOLUTION) ×3 IMPLANT
PACK BASIN DAY SURGERY FS (CUSTOM PROCEDURE TRAY) ×3 IMPLANT
PAD ALCOHOL SWAB (MISCELLANEOUS) ×24 IMPLANT
PAD CAST 3X4 CTTN HI CHSV (CAST SUPPLIES) ×1 IMPLANT
PADDING CAST ABS 4INX4YD NS (CAST SUPPLIES) ×2
PADDING CAST ABS COTTON 4X4 ST (CAST SUPPLIES) ×1 IMPLANT
PADDING CAST COTTON 3X4 STRL (CAST SUPPLIES) ×2
SHEET MEDIUM DRAPE 40X70 STRL (DRAPES) ×3 IMPLANT
STOCKINETTE 4X48 STRL (DRAPES) ×3 IMPLANT
SUT PROLENE 4 0 PS 2 18 (SUTURE) ×3 IMPLANT
SYR BULB 3OZ (MISCELLANEOUS) ×3 IMPLANT
SYR CONTROL 10ML LL (SYRINGE) ×6 IMPLANT
TOWEL OR 17X24 6PK STRL BLUE (TOWEL DISPOSABLE) ×3 IMPLANT
TOWEL OR NON WOVEN STRL DISP B (DISPOSABLE) ×3 IMPLANT
TRAY DSU PREP LF (CUSTOM PROCEDURE TRAY) ×3 IMPLANT
UNDERPAD 30X30 (UNDERPADS AND DIAPERS) ×3 IMPLANT

## 2016-04-28 NOTE — Discharge Instructions (Signed)
Please take vitamin B 6 200 mg a day to promote nerve health  Keep bandage clean and dry.  Call for any problems.  No smoking.  Criteria for driving a car: you should be off your pain medicine for 7-8 hours, able to drive one handed(confident), thinking clearly and feeling able in your judgement to drive. Continue elevation as it will decrease swelling.  If instructed by MD move your fingers within the confines of the bandage/splint.  Use ice if instructed by your MD. Call immediately for any sudden loss of feeling in your hand/arm or change in functional abilities of the extremity.We recommend that you to take vitamin C 1000 mg a day to promote healing. We also recommend that if you require  pain medicine that you take a stool softener to prevent constipation as most pain medicines will have constipation side effects. We recommend either Peri-Colace or Senokot and recommend that you also consider adding MiraLAX as well to prevent the constipation affects from pain medicine if you are required to use them. These medicines are over the counter and may be purchased at a local pharmacy. A cup of yogurt and a probiotic can also be helpful during the recovery process as the medicines can disrupt your intestinal environment.   Call your surgeon if you experience:   1.  Fever over 101.0. 2.  Inability to urinate. 3.  Nausea and/or vomiting. 4.  Extreme swelling or bruising at the surgical site. 5.  Continued bleeding from the incision. 6.  Increased pain, redness or drainage from the incision. 7.  Problems related to your pain medication. 8.  Any problems and/or concerns   Post Anesthesia Home Care Instructions  Activity: Get plenty of rest for the remainder of the day. A responsible individual must stay with you for 24 hours following the procedure.  For the next 24 hours, DO NOT: -Drive a car -Advertising copywriter -Drink alcoholic beverages -Take any medication unless instructed by your  physician -Make any legal decisions or sign important papers.  Meals: Start with liquid foods such as gelatin or soup. Progress to regular foods as tolerated. Avoid greasy, spicy, heavy foods. If nausea and/or vomiting occur, drink only clear liquids until the nausea and/or vomiting subsides. Call your physician if vomiting continues.  Special Instructions/Symptoms: Your throat may feel dry or sore from the anesthesia or the breathing tube placed in your throat during surgery. If this causes discomfort, gargle with warm salt water. The discomfort should disappear within 24 hours.  If you had a scopolamine patch placed behind your ear for the management of post- operative nausea and/or vomiting:  1. The medication in the patch is effective for 72 hours, after which it should be removed.  Wrap patch in a tissue and discard in the trash. Wash hands thoroughly with soap and water. 2. You may remove the patch earlier than 72 hours if you experience unpleasant side effects which may include dry mouth, dizziness or visual disturbances. 3. Avoid touching the patch. Wash your hands with soap and water after contact with the patch.

## 2016-04-28 NOTE — Op Note (Signed)
dictation [phone] Procedure: Left carpal tunnel release [name] M.D.

## 2016-04-28 NOTE — Transfer of Care (Signed)
Immediate Anesthesia Transfer of Care Note  Patient: Eliseo Withers Schmutz  Procedure(s) Performed: Procedure(s): limited open CTR (Left)  Patient Location: PACU  Anesthesia Type:MAC  Level of Consciousness: awake, alert , oriented and patient cooperative  Airway & Oxygen Therapy: Patient Spontanous Breathing  Post-op Assessment: Report given to RN and Post -op Vital signs reviewed and stable  Post vital signs: Reviewed and stable  Last Vitals:  Vitals:   04/28/16 0644  BP: 114/68  Pulse: (!) 55  Resp: 20  Temp: 36.7 C    Last Pain:  Vitals:   04/28/16 0644  TempSrc: Oral  PainSc: 6       Patients Stated Pain Goal: 2 (40/34/74 2595)  Complications: No apparent anesthesia complications

## 2016-04-28 NOTE — Anesthesia Postprocedure Evaluation (Addendum)
Anesthesia Post Note  Patient: [name]  Procedure(s) Performed: Procedure(s) (LRB): limited open CTR (Left)  Patient location during evaluation: PACU Anesthesia Type: MAC Level of consciousness: awake Pain management: pain level controlled Vital Signs Assessment: post-procedure vital signs reviewed and stable Respiratory status: spontaneous breathing Cardiovascular status: stable Postop Assessment: no signs of nausea or vomiting Anesthetic complications: no        Last Vitals:  Vitals:   04/28/16 0830 04/28/16 0840  BP: 134/83 133/83  Pulse: (!) 49 (!) 53  Resp: 14 16  Temp: 36.6 C     Last Pain:  Vitals:   04/28/16 0830  TempSrc:   PainSc: 0-No pain   Pain Goal: Patients Stated Pain Goal: 2 (04/28/16 0644)               Yaritza Leist JR,JOHN Mateo Flow

## 2016-04-28 NOTE — Op Note (Signed)
NAME:  [name]                       ACCOUNT NO.:  MEDICAL RECORD NO.:  [id number]  LOCATION:                                 FACILITY:  PHYSICIAN:  Dionne Ano. Amanda Pea, M.D.     DATE OF BIRTH:  DATE OF PROCEDURE:04/28/2016 DATE OF DISCHARGE:04/28/2016                              OPERATIVE REPORT   PREOPERATIVE DIAGNOSIS:  Left carpal tunnel syndrome.  POSTOPERATIVE DIAGNOSIS:  Left carpal tunnel syndrome.  SURGICAL PROCEDURE:  Left limited open carpal tunnel release.  SURGEON:  Dionne Ano. Amanda Pea, MD.  ASSISTANT:  None.  COMPLICATIONS:  None.  ANESTHESIA:  Peripheral nerve block with IV sedation keeping the patient awake, alert, and oriented the entire case.  TOURNIQUET TIME:  Less than 15 minutes.  INDICATIONS:  [name] is a 46 year old male with significant musculoskeletal problems.  I have discussed with him risks and benefits of surgery and he desires to proceed.  PROCEDURE DESCRIPTION:  The patient was seen by myself and Anesthesia, taken to the operative theater, underwent smooth induction of median nerve/peripheral nerve block with Sensorcaine and lidocaine without epinephrine approximately 19 mL.  Following injection, he was prepped and draped in usual sterile fashion with Betadine scrub and paint.  I should note that preoperatively I gave him personally a Hibiclens scrub and alcohol prep as well.  Following securing the time-out, the patient had the arm elevated, tourniquet was insufflated.  A 1.5 cm incision was made at the distal edge of the transverse carpal ligament.  Dissection was carried down.  Palmar fascia incised.  Distal edge was released under 4.0 loupe magnification without difficulty.  I identified the superficial palmar arch and fat pad aggression.  Following this, I dissected in a distal to proximal direction until adequate room was available for canal, prepared toward device 1, 2, and 3, we placed just under the proximal leading leaflet of  the transverse carpal ligament. Following this, the security clip was placed, obturator disengaged and security knife was placed and security clip effectively releasing the proximal leaflet of transverse carpal ligament.  The patient tolerated this well.  There were no complicating features.  Once this was complete, I irrigated copiously, deflated the tourniquet, secured hemostasis, bipolar electrocautery as necessary and closed wound with Prolene after hemostasis was secured.  The patient was awake, alert, and oriented during all points and passages and did quite well with the surgical procedure.  I was quite pleased with this.  The patient will be monitored in the recovery room, discharged to home. He does take pain medicine due to back and lower extremity ailments.  I have asked him to simply take the oxycodone and I gave him in lieu of the pain management algorithm.  He is usually on __________ the acute pain is over to resume his usual routine.  Do's and don'ts have been discussed.  We will see him in a week.  We will see him in 12 days for therapeutic endeavors.     Dionne Ano. Amanda Pea, M.D.     [facility]  D:  04/28/2016  T:  04/28/2016  Job:  [id number]

## 2016-04-28 NOTE — Anesthesia Procedure Notes (Signed)
Procedure Name: MAC Date/Time: 04/28/2016 7:46 AM Performed by: Samia Kukla D Pre-anesthesia Checklist: Patient identified, Emergency Drugs available, Suction available, Patient being monitored and Timeout performed Patient Re-evaluated:Patient Re-evaluated prior to inductionOxygen Delivery Method: Simple face mask

## 2016-04-28 NOTE — H&P (Signed)
[name] is an 46 y.o. male.   Chief Complaint: L CTS HPI: presents for L CTR Patient presents for evaluation and treatment of the of their upper extremity predicament. The patient denies neck, back, chest or  abdominal pain. The patient notes that they have no lower extremity problems. The patients primary complaint is noted. We are planning surgical care pathway for the upper extremity.  Past Medical History:  Diagnosis Date  . Allergy   . Alpha-1-antitrypsin deficiency (HCC)   . Apnea   . Asthma   . Chronic back pain    sees Dr Jeanene Erb for pain contract  . Complication of anesthesia    had trouble waking up  . Depression   . Hypoglycemia   . Migraines   . Pneumonia    has had 6-7 times  . Restless leg   . Sinusitis, chronic   . Sleep apnea    records on chart, severe, stops breathing 100+ times per night, uses cpap setting 18, warm air machine  . Spinal fusion failure (HCC)    spinal fluid leak    Past Surgical History:  Procedure Laterality Date  . BACK SURGERY     x3  . CHOLECYSTECTOMY    . LUMBAR FUSION  L45 the L-S1 revision   spinal leak complication  . LUMBAR LAMINECTOMY/DECOMPRESSION MICRODISCECTOMY  12/14/2011   Procedure: LUMBAR LAMINECTOMY/DECOMPRESSION MICRODISCECTOMY 1 LEVEL;  Surgeon: Reinaldo Meeker, MD;  Location: [facility];  Service: Neurosurgery;  Laterality: Left;  Left Lumbar Five-Sacral One Decompressive Foraminotomy  . SPINAL FUSION     l4-5 then l3-5 revision with failure    Family History  Problem Relation Age of Onset  . Hypertension Mother   . Hyperlipidemia Father   . Heart disease Father   . Coronary artery disease     Social History:  reports that he has never smoked. He has never used smokeless tobacco. He reports that he does not drink alcohol or use drugs.  Allergies: No Known Allergies  Medications Prior to Admission  Medication Sig Dispense Refill  . albuterol (PROVENTIL HFA;VENTOLIN HFA) 108 (90 BASE)  MCG/ACT inhaler Inhale 2 puffs into the lungs every 6 (six) hours as needed for wheezing or shortness of breath. 1 Inhaler 5  . clonazePAM (KLONOPIN) 1 MG tablet TAKE 1 TABLET BY MOUTH AT BEDTIME AS NEEDED SLEEP (Patient taking differently: 2 mg. TAKE 1 TABLET BY MOUTH AT BEDTIME AS NEEDED SLEEP) 30 tablet 3  . cyclobenzaprine (FLEXERIL) 10 MG tablet Take 1 tablet (10 mg total) by mouth 3 (three) times daily as needed for muscle spasms. 60 tablet 2  . DULoxetine (CYMBALTA) 60 MG capsule Take 60 mg by mouth 2 (two) times daily.    Marland Kitchen gabapentin (NEURONTIN) 600 MG tablet TAKE 1 TABLET 4 TO 5 TIMES A DAY 120 tablet 2  . HYDROcodone-acetaminophen (NORCO/VICODIN) 5-325 MG tablet Take 1 tablet by mouth every 8 (eight) hours as needed for moderate pain. 90 tablet 0  . Lorcaserin HCl 10 MG TABS Take 10 mg by mouth 2 (two) times daily. 60 tablet 3  . metoCLOPramide (REGLAN) 5 MG tablet Take 5 mg by mouth 4 (four) times daily as needed.    Marland Kitchen rOPINIRole (REQUIP) 1 MG tablet Take 2 mg by mouth 3 (three) times daily.    . SUMAtriptan (IMITREX) 100 MG tablet Take 100 mg by mouth every 2 (two) hours as needed.    . Topiramate (TOPAMAX PO) Take 100 mg by mouth.     Marland Kitchen  fluticasone (FLOVENT HFA) 110 MCG/ACT inhaler Inhale 1 puff into the lungs 2 (two) times daily. 1 Inhaler 5  . salmeterol (SEREVENT) 50 MCG/DOSE diskus inhaler Inhale 1 puff into the lungs 2 (two) times daily. 1 Inhaler 5    No results found for this or any previous visit (from the past 48 hour(s)). No results found.  Review of Systems  Eyes: Negative.   Respiratory: Negative.   Cardiovascular: Negative.   Gastrointestinal: Negative.   Genitourinary: Negative.     Blood pressure 114/68, pulse (!) 55, temperature 98.1 F (36.7 C), temperature source Oral, resp. rate 20, height 6\' 2"  (1.88 m), weight (!) 142 kg (313 lb), SpO2 98 %. Physical Exam L CTS with positive tinels phalens and MNCT The patient is alert and oriented in no acute  distress. The patient complains of pain in the affected upper extremity.  The patient is noted to have a normal HEENT exam. Lung fields show equal chest expansion and no shortness of breath. Abdomen exam is nontender without distention. Lower extremity examination does not show any fracture dislocation or blood clot symptoms. Pelvis is stable and the neck and back are stable and nontender.  Assessment/Plan Plan Left carpal tunnel release (CTR) We are planning surgery for your upper extremity. The risk and benefits of surgery to include risk of bleeding, infection, anesthesia,  damage to normal structures and failure of the surgery to accomplish its intended goals of relieving symptoms and restoring function have been discussed in detail. With this in mind we plan to proceed. I have specifically discussed with the patient the pre-and postoperative regime and the dos and don'ts and risk and benefits in great detail. Risk and benefits of surgery also include risk of dystrophy(CRPS), chronic nerve pain, failure of the healing process to go onto completion and other inherent risks of surgery The relavent the pathophysiology of the disease/injury process, as well as the alternatives for treatment and postoperative course of action has been discussed in great detail with the patient who desires to proceed.  We will do everything in our power to help you (the patient) restore function to the upper extremity. It is a pleasure to see this patient today.   Karen Chafe, MD 04/28/2016, 7:34 AM

## 2016-05-02 ENCOUNTER — Encounter (HOSPITAL_BASED_OUTPATIENT_CLINIC_OR_DEPARTMENT_OTHER): Payer: Self-pay | Admitting: Orthopedic Surgery

## 2016-05-03 DIAGNOSIS — M94262 Chondromalacia, left knee: Secondary | ICD-10-CM | POA: Diagnosis not present

## 2016-05-03 DIAGNOSIS — M6752 Plica syndrome, left knee: Secondary | ICD-10-CM | POA: Diagnosis not present

## 2016-05-03 DIAGNOSIS — G8918 Other acute postprocedural pain: Secondary | ICD-10-CM | POA: Diagnosis not present

## 2016-05-03 DIAGNOSIS — M659 Synovitis and tenosynovitis, unspecified: Secondary | ICD-10-CM | POA: Diagnosis not present

## 2016-05-09 ENCOUNTER — Ambulatory Visit: Payer: Medicare Other | Admitting: Physical Therapy

## 2016-05-15 ENCOUNTER — Ambulatory Visit: Payer: Medicare Other | Admitting: Physical Therapy

## 2016-05-16 ENCOUNTER — Ambulatory Visit: Payer: Medicare Other | Admitting: Physical Therapy

## 2016-05-18 ENCOUNTER — Ambulatory Visit: Payer: Medicare Other | Admitting: Physical Therapy

## 2016-05-22 ENCOUNTER — Encounter: Payer: Medicare Other | Admitting: Physical Therapy

## 2016-05-22 ENCOUNTER — Ambulatory Visit: Payer: Medicare Other | Admitting: Physical Therapy

## 2016-05-23 ENCOUNTER — Ambulatory Visit: Payer: Medicare Other | Attending: Orthopedic Surgery | Admitting: Physical Therapy

## 2016-05-23 DIAGNOSIS — R262 Difficulty in walking, not elsewhere classified: Secondary | ICD-10-CM | POA: Insufficient documentation

## 2016-05-23 DIAGNOSIS — M25562 Pain in left knee: Secondary | ICD-10-CM | POA: Insufficient documentation

## 2016-05-23 DIAGNOSIS — G8929 Other chronic pain: Secondary | ICD-10-CM | POA: Diagnosis not present

## 2016-05-23 NOTE — Patient Instructions (Addendum)
Gait observation -- notable for lateral trunk flexion in L sided weightbearing. Appropriate heel strike and loading response, decreased knee/hip flexion to initiate swing phase (delayed in completion)   MMT  Knee flexion - 4to 4+/5 with mild pain Extension 5/5 but mild pain on LLE Hip flexion - WNL Hip IR - WNL Hip ER  - WNL   Reflexes  Ankle - 2+ bilaterally  Patellar reflexes - 1+ bilaterally   Knee extension-flexion ROM (-2) -112 in supine (-5) - 128 in supine on RLE  Supine bridging -- pain in L knee currently -- unable to complete.   Hip extension - painful on LLE   SLR-- full ROM on RLE, able to complete x5 midway through range on LLE due to quad weakness.  Educated patient on completion of quad sets with towel roll underneath his heel x 10 repetitions to facilitate full extension ROM.  Educated patient on patellar mobilizations in medial and lateral directions x 10 to reduce tightness around patella as part of HEP  Educated patient on heel slides with towel used for overpressure of L knee into flexion.

## 2016-05-26 ENCOUNTER — Ambulatory Visit: Payer: Medicare Other | Admitting: Physical Therapy

## 2016-05-26 DIAGNOSIS — M25562 Pain in left knee: Principal | ICD-10-CM

## 2016-05-26 DIAGNOSIS — G8929 Other chronic pain: Secondary | ICD-10-CM

## 2016-05-26 DIAGNOSIS — R262 Difficulty in walking, not elsewhere classified: Secondary | ICD-10-CM

## 2016-05-26 NOTE — Therapy (Signed)
[facility] [address], [place], [address] Phone: [phone]   Fax:  [phone]  Physical Therapy Treatment  Patient Details  Name: [name] MRN: [id number] Date of Birth: [birth year] No Data Recorded  Encounter Date: 05/26/2016      PT End of Session - 05/26/16 1130    Visit Number 2   Number of Visits 13   Date for PT Re-Evaluation 07/18/16   PT Start Time 1040   PT Stop Time 1124   PT Time Calculation (min) 44 min   Activity Tolerance Patient tolerated treatment well   Behavior During Therapy [facility] for tasks assessed/performed      Past Medical History:  Diagnosis Date  . Allergy   . Alpha-1-antitrypsin deficiency (HCC)   . Apnea   . Asthma   . Chronic back pain    sees Dr Jeanene Erb for pain contract  . Complication of anesthesia    had trouble waking up  . Depression   . Hypoglycemia   . Migraines   . Pneumonia    has had 6-7 times  . Restless leg   . Sinusitis, chronic   . Sleep apnea    records on chart, severe, stops breathing 100+ times per night, uses cpap setting 18, warm air machine  . Spinal fusion failure (HCC)    spinal fluid leak    Past Surgical History:  Procedure Laterality Date  . BACK SURGERY     x3  . CARPAL TUNNEL RELEASE Left 04/28/2016   Procedure: limited open CTR;  Surgeon: Dominica Severin, MD;  Location: [facility];  Service: Orthopedics;  Laterality: Left;  . CHOLECYSTECTOMY    . LUMBAR FUSION  L45 the L-S1 revision   spinal leak complication  . LUMBAR LAMINECTOMY/DECOMPRESSION MICRODISCECTOMY  12/14/2011   Procedure: LUMBAR LAMINECTOMY/DECOMPRESSION MICRODISCECTOMY 1 LEVEL;  Surgeon: Reinaldo Meeker, MD;  Location: [facility];  Service: Neurosurgery;  Laterality: Left;  Left Lumbar Five-Sacral One Decompressive Foraminotomy  . SPINAL FUSION     l4-5 then l3-5 revision with failure    There were no vitals filed for this  visit.      Subjective Assessment - 05/26/16 1128    Subjective Patient reports he over-did it yesterday and had more pain than at any other time after his surgery (walking, too much activity on his LLE). Reports he has been diligent with his HEP.    Patient is accompained by: Family member  Daughter    Limitations Standing;Walking;House hold activities   Diagnostic tests X-ray indicating plica/loose body.    Patient Stated Goals Patient would like to get back to exercising at the [facility], would to go hiking. Would like to not be afraid of falling.    Currently in Pain? Yes   Pain Score --  Does not rate, but reports moderate pain level around the knee, particularly medial incision.    Pain Location Knee   Pain Orientation Left   Pain Descriptors / Indicators Aching   Pain Type Chronic pain;Surgical pain   Pain Onset More than a month ago   Pain Frequency Constant      Sit to stands 4 sets x8 repetitions with [place] Stim to 49 mA at distal quadriceps  SLRs on LLE x 8 through full range with 96mA of [place] stim applied to distal quadriceps  -5 extension to 125 flexion on the L with 150V High Volt  Leg Press at 55# x6  double leg concentric - single leg eccentric (on LLE)  Standing hip abduction machine - 2 sets x 10 repetitions (40#)   Performed soft tissue mobilization over medial portion of knee where patient was reporting pain. Patient reported decreased pain/symptoms afterwards.                            PT Education - 05/26/16 1129    Education provided Yes   Education Details Added mini-squats with HHA to HEP    Person(s) Educated Patient   Methods Explanation   Comprehension Verbalized understanding             PT Long Term Goals - 05/23/16 1830      PT LONG TERM GOAL #1   Title Patient will demonstrate at least 125 degrees of knee flexion on LLE to demonstrate improved ROM associated with decreased pain.    Time 6   Period Weeks   Status  New     PT LONG TERM GOAL #2   Title Patient will complete 5x sit to stand in less than 12 seconds without use of UEs to demonstrate improved LE strength and power for return to ADLs.    Time 6   Period Weeks   Status New     PT LONG TERM GOAL #3   Title Patient will report worst pain level of less than 2/10 to demonstrate improved tolerance for ADLs.    Time 6   Period Weeks   Status New     PT LONG TERM GOAL #4   Title Patient will report LEFS of greater than 50/80 to demonstrate improved tolerance for ADLs.    Time 6   Period Weeks   Status New               Plan - 05/26/16 1130    Clinical Impression Statement Patient demonstrates excellent increase in ROM, now full extension, 3 degree deficit in flexion relative to contralateral side. He had acute increase in pain from increased activity, but residual swelling is nominal. He reports decreased pain after STM, improving quadricep contraction with e-stim applied this date. He is progressing well towards established mobility goals.    Rehab Potential Good   PT Frequency 2x / week   PT Duration 6 weeks   PT Treatment/Interventions Aquatic Therapy;Biofeedback;Cryotherapy;Moist Heat;Stair training;Gait training;Neuromuscular re-education;Patient/family education;Therapeutic activities;Therapeutic exercise;Dry needling;Taping;Passive range of motion;Balance training;Manual techniques   PT Next Visit Plan Re-assess ROM, progress quad strengthening as tolerated, pain control if needed.    PT Home Exercise Plan Quad set, knee flexion with towel for over-pressure, patellar mobilizations, SLRs    Consulted and Agree with Plan of Care Patient      Patient will benefit from skilled therapeutic intervention in order to improve the following deficits and impairments:  Abnormal gait, Pain, Decreased balance, Difficulty walking, Decreased endurance, Decreased activity tolerance, Decreased strength, Decreased range of motion, Decreased  mobility  Visit Diagnosis: Chronic pain of left knee  Difficulty in walking, not elsewhere classified     Problem List Patient Active Problem List   Diagnosis Date Noted  . Paresthesias in left hand 12/07/2014  . Paresthesias in right hand 12/07/2014  . Chronic pain syndrome 06/19/2014  . Postlaminectomy syndrome, lumbar region 03/02/2014  . Alpha-1-antitrypsin deficiency (HCC) 02/19/2012  . Spinal fusion failure (HCC) 05/04/2010  . Chronic headaches 05/04/2010  . Depression 05/04/2010  . URI 02/10/2009  . DEGENERATIVE DISC DISEASE, LUMBAR SPINE, WITH MYELOPATHY  02/10/2009  . SLEEP APNEA, OBSTRUCTIVE 03/06/2008  . RESTLESS LEG SYNDROME, SEVERE 12/12/2007  . SINUSITIS, CHRONIC 12/12/2007  . Obesity, unspecified 09/23/2007  . ALLERGIC RHINITIS 11/21/2006  . ASTHMA 11/21/2006   Alva Garnet PT, DPT, CSCS    05/26/2016, 11:32 AM  [facility] PHYSICAL AND SPORTS MEDICINE [address], [place], [address] Phone: [phone]   Fax:  [phone]  Name: [name] MRN: [id number] Date of Birth: 01-08-1971

## 2016-05-26 NOTE — Therapy (Signed)
[facility] [address] [place], [address] Phone: [phone]   Fax:  [phone]  Physical Therapy Evaluation  Patient Details  Name: [name] MRN: [id number] Date of Birth: [birth year] No Data Recorded  Encounter Date: 05/23/2016      PT End of Session - 05/26/16 1124    Visit Number 1   Number of Visits 13   Date for PT Re-Evaluation 07/18/16   PT Start Time 1400   PT Stop Time 1442   PT Time Calculation (min) 42 min   Activity Tolerance Patient tolerated treatment well;Patient limited by pain   Behavior During Therapy [facility] for tasks assessed/performed      Past Medical History:  Diagnosis Date  . Allergy   . Alpha-1-antitrypsin deficiency (HCC)   . Apnea   . Asthma   . Chronic back pain    sees Dr Jeanene Erb for pain contract  . Complication of anesthesia    had trouble waking up  . Depression   . Hypoglycemia   . Migraines   . Pneumonia    has had 6-7 times  . Restless leg   . Sinusitis, chronic   . Sleep apnea    records on chart, severe, stops breathing 100+ times per night, uses cpap setting 18, warm air machine  . Spinal fusion failure (HCC)    spinal fluid leak    Past Surgical History:  Procedure Laterality Date  . BACK SURGERY     x3  . CARPAL TUNNEL RELEASE Left 04/28/2016   Procedure: limited open CTR;  Surgeon: Dominica Severin, MD;  Location: [facility];  Service: Orthopedics;  Laterality: Left;  . CHOLECYSTECTOMY    . LUMBAR FUSION  L45 the L-S1 revision   spinal leak complication  . LUMBAR LAMINECTOMY/DECOMPRESSION MICRODISCECTOMY  12/14/2011   Procedure: LUMBAR LAMINECTOMY/DECOMPRESSION MICRODISCECTOMY 1 LEVEL;  Surgeon: Reinaldo Meeker, MD;  Location: [facility];  Service: Neurosurgery;  Laterality: Left;  Left Lumbar Five-Sacral One Decompressive Foraminotomy  . SPINAL FUSION     l4-5 then l3-5 revision with failure    There were no  vitals filed for this visit.       Subjective Assessment - 05/26/16 1123    Subjective Patient reports he had L pica removal and arthroscopy on 05/03/2016. Patient reports he hurt his L knee in September of last year, was improving but had a fall b/c of his spine and has been progressively. He initially injured while walking in a field. He reports he has weakness in his L leg, and "jerking" in his L leg due to spinal cord encroachment. Patient has Lumbar fusions, has an MRI coming up.    Patient is accompained by: Family member  Daughter    Limitations Standing;Walking;House hold activities   Diagnostic tests X-ray indicating plica/loose body.    Patient Stated Goals Patient would like to get back to exercising at the [facility], would to go hiking. Would like to not be afraid of falling.    Currently in Pain? Yes   Pain Score --  Anywhere from 6-8/10   Pain Location Knee   Pain Orientation Left   Pain Descriptors / Indicators Aching   Pain Type Chronic pain;Surgical pain   Pain Onset More than a month ago   Pain Frequency Constant   Aggravating Factors  Excessive weightbearing    Pain Relieving Factors Rest       Gait observation -- notable for  lateral trunk flexion in L sided weightbearing. Appropriate heel strike and loading response, decreased knee/hip flexion to initiate swing phase (delayed in completion)   MMT  Knee flexion - 4to 4+/5 with mild pain Extension 5/5 but mild pain on LLE Hip flexion - WNL Hip IR - WNL Hip ER  - WNL   Reflexes  Ankle - 2+ bilaterally  Patellar reflexes - 1+ bilaterally   Knee extension-flexion ROM (-2) -112 in supine (-5) - 128 in supine on RLE  Supine bridging -- pain in L knee currently -- unable to complete.   Hip extension - painful on LLE   Therapuetic Exercise  SLR-- full ROM on RLE, able to complete x5 midway through range on LLE due to quad weakness.  Educated patient on completion of quad sets with towel roll underneath his heel x  10 repetitions to facilitate full extension ROM.  Educated patient on patellar mobilizations in medial and lateral directions x 10 to reduce tightness around patella as part of HEP  Educated patient on heel slides with towel used for overpressure of L knee into flexion.                           PT Education - 05/26/16 1123    Education provided Yes   Education Details Provided HEP, timeline and criteria of ROM and strength for improvement of symptoms.    Person(s) Educated Patient;Child(ren)   Methods Explanation;Demonstration;Handout   Comprehension Returned demonstration             PT Long Term Goals - 05/23/16 1830      PT LONG TERM GOAL #1   Title Patient will demonstrate at least 125 degrees of knee flexion on LLE to demonstrate improved ROM associated with decreased pain.    Time 6   Period Weeks   Status New     PT LONG TERM GOAL #2   Title Patient will complete 5x sit to stand in less than 12 seconds without use of UEs to demonstrate improved LE strength and power for return to ADLs.    Time 6   Period Weeks   Status New     PT LONG TERM GOAL #3   Title Patient will report worst pain level of less than 2/10 to demonstrate improved tolerance for ADLs.    Time 6   Period Weeks   Status New     PT LONG TERM GOAL #4   Title Patient will report LEFS of greater than 50/80 to demonstrate improved tolerance for ADLs.    Time 6   Period Weeks   Status New               Plan - 05/26/16 1124    Clinical Impression Statement Patient is s/p plica removal and demonstrates decreased ROM into both flexion and extension as well as significant quadricep weakness. He has no weightbearing restrictions, though does report pain (likely a contribution of strength and ROM deficits). He was provided with HEP to address relevant deficits and would benefit from skilled PT services to address his ROM and strength deficits limiting his ability to ambulate  community distances with minimal pain.    Rehab Potential Good   PT Frequency 2x / week   PT Duration 6 weeks   PT Treatment/Interventions Aquatic Therapy;Biofeedback;Cryotherapy;Moist Heat;Stair training;Gait training;Neuromuscular re-education;Patient/family education;Therapeutic activities;Therapeutic exercise;Dry needling;Taping;Passive range of motion;Balance training;Manual techniques   PT Next Visit Plan Re-assess ROM, progress quad strengthening  as tolerated, pain control if needed.    PT Home Exercise Plan Quad set, knee flexion with towel for over-pressure, patellar mobilizations, SLRs    Consulted and Agree with Plan of Care Patient      Patient will benefit from skilled therapeutic intervention in order to improve the following deficits and impairments:  Abnormal gait, Pain, Decreased balance, Difficulty walking, Decreased endurance, Decreased activity tolerance, Decreased strength, Decreased range of motion, Decreased mobility  Visit Diagnosis: Chronic pain of left knee - Plan: PT plan of care cert/re-cert  Difficulty in walking, not elsewhere classified - Plan: PT plan of care cert/re-cert     Problem List Patient Active Problem List   Diagnosis Date Noted  . Paresthesias in left hand 12/07/2014  . Paresthesias in right hand 12/07/2014  . Chronic pain syndrome 06/19/2014  . Postlaminectomy syndrome, lumbar region 03/02/2014  . Alpha-1-antitrypsin deficiency (HCC) 02/19/2012  . Spinal fusion failure (HCC) 05/04/2010  . Chronic headaches 05/04/2010  . Depression 05/04/2010  . URI 02/10/2009  . DEGENERATIVE DISC DISEASE, LUMBAR SPINE, WITH MYELOPATHY 02/10/2009  . SLEEP APNEA, OBSTRUCTIVE 03/06/2008  . RESTLESS LEG SYNDROME, SEVERE 12/12/2007  . SINUSITIS, CHRONIC 12/12/2007  . Obesity, unspecified 09/23/2007  . ALLERGIC RHINITIS 11/21/2006  . ASTHMA 11/21/2006   Alva Garnet PT, DPT, CSCS    05/26/2016, 11:27 AM  [facility] PHYSICAL AND SPORTS MEDICINE [address], [place], [address] Phone: [phone]   Fax:  [phone]  Name: ROMYN ABEYTA MRN: [id number] Date of Birth: [birth year]

## 2016-05-29 ENCOUNTER — Encounter: Payer: Medicare Other | Admitting: Physical Therapy

## 2016-05-31 ENCOUNTER — Ambulatory Visit: Payer: Medicare Other | Admitting: Physical Therapy

## 2016-06-01 ENCOUNTER — Ambulatory Visit: Payer: Medicare Other | Admitting: Physical Therapy

## 2016-06-02 ENCOUNTER — Ambulatory Visit: Payer: Medicare Other | Admitting: Physical Therapy

## 2016-06-06 ENCOUNTER — Ambulatory Visit: Payer: Medicare Other | Attending: Orthopedic Surgery | Admitting: Physical Therapy

## 2016-06-06 DIAGNOSIS — G8929 Other chronic pain: Secondary | ICD-10-CM | POA: Diagnosis not present

## 2016-06-06 DIAGNOSIS — M25562 Pain in left knee: Secondary | ICD-10-CM | POA: Diagnosis not present

## 2016-06-06 DIAGNOSIS — R262 Difficulty in walking, not elsewhere classified: Secondary | ICD-10-CM | POA: Insufficient documentation

## 2016-06-06 NOTE — Patient Instructions (Signed)
ROM 0-126 on LLE (pain just at end range flexion)   Soft tissue mobilization to distal medial and lateral quadriceps and medial/lateral components of patellar tendon   Estim to medial and lateral components of patellar tendon x 5 minutes at 170 V   HS Curls at 25# x 20, progressed to 55# x 15, 75# x10   TRX Squats x 12 to appropriate depth x 2 sets   TRX Lunges (quite hard x 5 repetitions)   Step ups to 2 risers with 20#KB x 12 repetition s -- x 2 sets   Hip Thrusts (likely too much knee fleixon/extension felt more in anterior knee) 2 sets x 6 repetitions with bilateral LEs   Single leg deadlifts on blue side of BOSU x 10 per side with HHA (felt quite fatigued at completion.

## 2016-06-07 NOTE — Therapy (Signed)
[facility] [address] [place], [address] Phone: [phone]   Fax:  [phone]  Physical Therapy Treatment  Patient Details  Name: [name] MRN: [id number] Date of Birth: 1970/09/06 No Data Recorded  Encounter Date: 06/06/2016      PT End of Session - 06/06/16 1658    Visit Number 3   Number of Visits 13   Date for PT Re-Evaluation 07/18/16   PT Start Time 1600   PT Stop Time 1646   PT Time Calculation (min) 46 min   Activity Tolerance Patient tolerated treatment well   Behavior During Therapy [facility] for tasks assessed/performed      Past Medical History:  Diagnosis Date  . Allergy   . Alpha-1-antitrypsin deficiency (HCC)   . Apnea   . Asthma   . Chronic back pain    sees Dr Jeanene Erb for pain contract  . Complication of anesthesia    had trouble waking up  . Depression   . Hypoglycemia   . Migraines   . Pneumonia    has had 6-7 times  . Restless leg   . Sinusitis, chronic   . Sleep apnea    records on chart, severe, stops breathing 100+ times per night, uses cpap setting 18, warm air machine  . Spinal fusion failure (HCC)    spinal fluid leak    Past Surgical History:  Procedure Laterality Date  . BACK SURGERY     x3  . CARPAL TUNNEL RELEASE Left 04/28/2016   Procedure: limited open CTR;  Surgeon: Dominica Severin, MD;  Location: [facility];  Service: Orthopedics;  Laterality: Left;  . CHOLECYSTECTOMY    . LUMBAR FUSION  L45 the L-S1 revision   spinal leak complication  . LUMBAR LAMINECTOMY/DECOMPRESSION MICRODISCECTOMY  12/14/2011   Procedure: LUMBAR LAMINECTOMY/DECOMPRESSION MICRODISCECTOMY 1 LEVEL;  Surgeon: Reinaldo Meeker, MD;  Location: [facility];  Service: Neurosurgery;  Laterality: Left;  Left Lumbar Five-Sacral One Decompressive Foraminotomy  . SPINAL FUSION     l4-5 then l3-5 revision with failure    There were no vitals filed for this  visit.      Subjective Assessment - 06/07/16 1250    Subjective Patient reports he was able to go to church without use of cane over the weekend, which he was not able to do last week. He reports his leg is feeling stronger, but continues to have some pain around the incision sites.    Patient is accompained by: Family member  Daughter    Limitations Standing;Walking;House hold activities   Diagnostic tests X-ray indicating plica/loose body.    Patient Stated Goals Patient would like to get back to exercising at the [facility], would to go hiking. Would like to not be afraid of falling.    Currently in Pain? Yes   Pain Score --  Rates as tolerable, was worse yesterday   Pain Orientation Left   Pain Descriptors / Indicators Aching   Pain Type Chronic pain;Surgical pain   Pain Onset More than a month ago   Pain Frequency Intermittent   Aggravating Factors  Increased walking/weightbearing.       ROM 0-126 on LLE (pain just at end range flexion)   Soft tissue mobilization to distal medial and lateral quadriceps and medial/lateral components of patellar tendon   Estim to medial and lateral components of patellar tendon x 5 minutes at 170 V   HS Curls at 25#  x 20, progressed to 55# x 15, 75# x10   TRX Squats x 12 to appropriate depth x 2 sets   TRX Lunges (quite hard x 5 repetitions) -- attempted lunges with HHA on the TMs.   Step ups to 2 risers with 20#KB x 12 repetition s -- x 2 sets   Hip Thrusts (likely too much knee fleixon/extension felt more in anterior knee) 2 sets x 6 repetitions with bilateral LEs   Single leg deadlifts on blue side of BOSU x 10 per side with HHA (felt quite fatigued at completion. )                             PT Education - 06/06/16 1658    Education provided Yes   Education Details Will provide HEP for gym based routine in next session.    Person(s) Educated Patient   Methods Explanation   Comprehension Verbalized understanding              PT Long Term Goals - 05/23/16 1830      PT LONG TERM GOAL #1   Title Patient will demonstrate at least 125 degrees of knee flexion on LLE to demonstrate improved ROM associated with decreased pain.    Time 6   Period Weeks   Status New     PT LONG TERM GOAL #2   Title Patient will complete 5x sit to stand in less than 12 seconds without use of UEs to demonstrate improved LE strength and power for return to ADLs.    Time 6   Period Weeks   Status New     PT LONG TERM GOAL #3   Title Patient will report worst pain level of less than 2/10 to demonstrate improved tolerance for ADLs.    Time 6   Period Weeks   Status New     PT LONG TERM GOAL #4   Title Patient will report LEFS of greater than 50/80 to demonstrate improved tolerance for ADLs.    Time 6   Period Weeks   Status New               Plan - 06/06/16 1658    Clinical Impression Statement Patient continues to improve in ROM and strength about L knee. He is still limited by pain, though this is likely related to back issues and residual chronic sensitization of the LLE. He is progressing well with strength, will continue to progress towards gym based rehabilitation program.    Rehab Potential Good   PT Frequency 2x / week   PT Duration 6 weeks   PT Treatment/Interventions Aquatic Therapy;Biofeedback;Cryotherapy;Moist Heat;Stair training;Gait training;Neuromuscular re-education;Patient/family education;Therapeutic activities;Therapeutic exercise;Dry needling;Taping;Passive range of motion;Balance training;Manual techniques   PT Next Visit Plan Re-assess ROM, progress quad strengthening as tolerated, pain control if needed.    PT Home Exercise Plan Quad set, knee flexion with towel for over-pressure, patellar mobilizations, SLRs    Consulted and Agree with Plan of Care Patient      Patient will benefit from skilled therapeutic intervention in order to improve the following deficits and impairments:   Abnormal gait, Pain, Decreased balance, Difficulty walking, Decreased endurance, Decreased activity tolerance, Decreased strength, Decreased range of motion, Decreased mobility  Visit Diagnosis: Chronic pain of left knee  Difficulty in walking, not elsewhere classified     Problem List Patient Active Problem List   Diagnosis Date Noted  . Paresthesias in left hand 12/07/2014  .  Paresthesias in right hand 12/07/2014  . Chronic pain syndrome 06/19/2014  . Postlaminectomy syndrome, lumbar region 03/02/2014  . Alpha-1-antitrypsin deficiency (HCC) 02/19/2012  . Spinal fusion failure (HCC) 05/04/2010  . Chronic headaches 05/04/2010  . Depression 05/04/2010  . URI 02/10/2009  . DEGENERATIVE DISC DISEASE, LUMBAR SPINE, WITH MYELOPATHY 02/10/2009  . SLEEP APNEA, OBSTRUCTIVE 03/06/2008  . RESTLESS LEG SYNDROME, SEVERE 12/12/2007  . SINUSITIS, CHRONIC 12/12/2007  . Obesity, unspecified 09/23/2007  . ALLERGIC RHINITIS 11/21/2006  . ASTHMA 11/21/2006   Alva Garnet PT, DPT, CSCS    06/07/2016, 12:53 PM  [facility] [facility] PHYSICAL AND SPORTS MEDICINE [address], [place], [address] Phone: [phone]   Fax:  [phone]  Name: CESAREO VICKREY MRN: [id number] Date of Birth: [birth year]

## 2016-06-08 ENCOUNTER — Ambulatory Visit: Payer: Medicare Other | Admitting: Physical Therapy

## 2016-06-13 ENCOUNTER — Ambulatory Visit: Payer: Medicare Other | Admitting: Physical Therapy

## 2016-06-15 ENCOUNTER — Ambulatory Visit: Payer: Medicare Other | Admitting: Physical Therapy

## 2016-06-15 DIAGNOSIS — G8929 Other chronic pain: Secondary | ICD-10-CM | POA: Diagnosis not present

## 2016-06-15 DIAGNOSIS — R262 Difficulty in walking, not elsewhere classified: Secondary | ICD-10-CM

## 2016-06-15 DIAGNOSIS — M25562 Pain in left knee: Principal | ICD-10-CM

## 2016-06-15 NOTE — Patient Instructions (Signed)
-  2 to 130 degrees actively   Sit to stands - painful  Initially   HS Curls - 55# x 8 for 2 sets   SLDLs

## 2016-06-16 NOTE — Therapy (Signed)
[name] [address], [place], [address] Phone: [phone]   Fax:  [phone]  Physical Therapy Treatment  Patient Details  Name: [name] MRN: [id number] Date of Birth: 01-17-1971 No Data Recorded  Encounter Date: 06/15/2016      PT End of Session - 06/16/16 1018    Visit Number 4   Number of Visits 13   Date for PT Re-Evaluation 07/18/16   PT Start Time 1700   PT Stop Time 1730   PT Time Calculation (min) 30 min   Activity Tolerance Patient tolerated treatment well   Behavior During Therapy [facility] for tasks assessed/performed      Past Medical History:  Diagnosis Date  . Allergy   . Alpha-1-antitrypsin deficiency (HCC)   . Apnea   . Asthma   . Chronic back pain    sees Dr Jeanene Erb for pain contract  . Complication of anesthesia    had trouble waking up  . Depression   . Hypoglycemia   . Migraines   . Pneumonia    has had 6-7 times  . Restless leg   . Sinusitis, chronic   . Sleep apnea    records on chart, severe, stops breathing 100+ times per night, uses cpap setting 18, warm air machine  . Spinal fusion failure (HCC)    spinal fluid leak    Past Surgical History:  Procedure Laterality Date  . BACK SURGERY     x3  . CARPAL TUNNEL RELEASE Left 04/28/2016   Procedure: limited open CTR;  Surgeon: Dominica Severin, MD;  Location: [facility];  Service: Orthopedics;  Laterality: Left;  . CHOLECYSTECTOMY    . LUMBAR FUSION  L45 the L-S1 revision   spinal leak complication  . LUMBAR LAMINECTOMY/DECOMPRESSION MICRODISCECTOMY  12/14/2011   Procedure: LUMBAR LAMINECTOMY/DECOMPRESSION MICRODISCECTOMY 1 LEVEL;  Surgeon: Reinaldo Meeker, MD;  Location: [facility];  Service: Neurosurgery;  Laterality: Left;  Left Lumbar Five-Sacral One Decompressive Foraminotomy  . SPINAL FUSION     l4-5 then l3-5 revision with failure    There were no vitals filed for this  visit.      Subjective Assessment - 06/15/16 1705    Subjective Patient reports his pain is coming down a good deal. He is due for an MRI in his spine due to weakness in his LLE.    Patient is accompained by: Family member  Daughter    Limitations Standing;Walking;House hold activities   Diagnostic tests X-ray indicating plica/loose body.    Patient Stated Goals Patient would like to get back to exercising at the [facility], would to go hiking. Would like to not be afraid of falling.    Currently in Pain? Yes   Pain Score --  Does not rate pain but reports it is much less than it has been and he is able to walk much more comfortably.    Pain Onset More than a month ago      -2 to 130 degrees actively   Sit to stands - painful  Initially. Cued to have wider stance, more vertical trunk which reduced but did not eliminate symptoms.,   HS Curls - 55# x 8 for 2 sets -- isometric hold at end flexion ROM for 5", no increase in pain reported   SLDLs  On BOSU with blue side up -- 2 sets x 8 repetitions (with HHA) good ROM noted, no increased pain noted-- performed bilaterally  Watched sit to stands after calf stretching performed on incline board, cuing for increased ER at feet/hips and allowed min A from his arms which reduced pain with sit to stand transition.   Provided gym based program including leg press, HS curls, SLDL, and sit to stands.                             PT Education - 06/16/16 1018    Education provided Yes   Education Details Provided maintenance and progressive loading program for [facility], will follow up in 2-3 weeks.    Person(s) Educated Patient   Methods Explanation;Demonstration;Handout   Comprehension Verbalized understanding;Returned demonstration             PT Long Term Goals - 05/23/16 1830      PT LONG TERM GOAL #1   Title Patient will demonstrate at least 125 degrees of knee flexion on LLE to demonstrate improved ROM associated  with decreased pain.    Time 6   Period Weeks   Status New     PT LONG TERM GOAL #2   Title Patient will complete 5x sit to stand in less than 12 seconds without use of UEs to demonstrate improved LE strength and power for return to ADLs.    Time 6   Period Weeks   Status New     PT LONG TERM GOAL #3   Title Patient will report worst pain level of less than 2/10 to demonstrate improved tolerance for ADLs.    Time 6   Period Weeks   Status New     PT LONG TERM GOAL #4   Title Patient will report LEFS of greater than 50/80 to demonstrate improved tolerance for ADLs.    Time 6   Period Weeks   Status New               Plan - 06/16/16 1018    Clinical Impression Statement Patient has normalized his ROM, reporting significant reduction in pain. He is due for an MRI of lumbar spine to rule out any neurologic cause(s) of his LLE weakness. He has progressed nicely with rehab of this surgery and was provided with gym based maintenance program. Will follow up in 2-3 weeks for likely discharge.    Rehab Potential Good   PT Frequency 2x / week   PT Duration 6 weeks   PT Treatment/Interventions Aquatic Therapy;Biofeedback;Cryotherapy;Moist Heat;Stair training;Gait training;Neuromuscular re-education;Patient/family education;Therapeutic activities;Therapeutic exercise;Dry needling;Taping;Passive range of motion;Balance training;Manual techniques   PT Next Visit Plan Re-assess ROM, progress quad strengthening as tolerated, pain control if needed.    PT Home Exercise Plan Quad set, knee flexion with towel for over-pressure, patellar mobilizations, SLRs    Consulted and Agree with Plan of Care Patient      Patient will benefit from skilled therapeutic intervention in order to improve the following deficits and impairments:  Abnormal gait, Pain, Decreased balance, Difficulty walking, Decreased endurance, Decreased activity tolerance, Decreased strength, Decreased range of motion, Decreased  mobility  Visit Diagnosis: Chronic pain of left knee  Difficulty in walking, not elsewhere classified     Problem List Patient Active Problem List   Diagnosis Date Noted  . Paresthesias in left hand 12/07/2014  . Paresthesias in right hand 12/07/2014  . Chronic pain syndrome 06/19/2014  . Postlaminectomy syndrome, lumbar region 03/02/2014  . Alpha-1-antitrypsin deficiency (HCC) 02/19/2012  . Spinal fusion failure (HCC) 05/04/2010  . Chronic headaches 05/04/2010  .  Depression 05/04/2010  . URI 02/10/2009  . DEGENERATIVE DISC DISEASE, LUMBAR SPINE, WITH MYELOPATHY 02/10/2009  . SLEEP APNEA, OBSTRUCTIVE 03/06/2008  . RESTLESS LEG SYNDROME, SEVERE 12/12/2007  . SINUSITIS, CHRONIC 12/12/2007  . Obesity, unspecified 09/23/2007  . ALLERGIC RHINITIS 11/21/2006  . ASTHMA 11/21/2006   Alva Garnet PT, DPT, CSCS    06/16/2016, 10:20 AM   [facility] [address] [place], [address] Phone: [phone]   Fax:  [phone]  Name: BRAXTYN BOJARSKI MRN: [id number] Date of Birth: 1970/02/23

## 2016-06-20 ENCOUNTER — Ambulatory Visit: Payer: Medicare Other | Admitting: Physical Therapy

## 2016-06-21 ENCOUNTER — Ambulatory Visit: Payer: Medicare Other

## 2016-06-21 ENCOUNTER — Telehealth: Payer: Self-pay

## 2016-06-21 NOTE — Telephone Encounter (Signed)
No show. Called patient who said that his appointment for today was supposed to be cancelled and should come back to PT next week. Will call back to schedule because he is currently walking into a doctor's appointment.

## 2016-06-22 ENCOUNTER — Ambulatory Visit: Payer: Medicare Other | Admitting: Physical Therapy

## 2016-06-28 ENCOUNTER — Ambulatory Visit: Payer: Medicare Other | Admitting: Physical Therapy

## 2016-07-07 DIAGNOSIS — J181 Lobar pneumonia, unspecified organism: Secondary | ICD-10-CM | POA: Diagnosis not present

## 2016-07-14 ENCOUNTER — Encounter (HOSPITAL_BASED_OUTPATIENT_CLINIC_OR_DEPARTMENT_OTHER): Payer: Self-pay | Admitting: Orthopedic Surgery

## 2016-07-14 NOTE — Addendum Note (Signed)
Addendum  created 07/14/16 1122 by Eily Louvier, MD   Sign clinical note    

## 2016-11-02 ENCOUNTER — Encounter: Payer: Self-pay | Admitting: Nurse Practitioner

## 2016-11-02 DIAGNOSIS — E6609 Other obesity due to excess calories: Secondary | ICD-10-CM | POA: Diagnosis not present

## 2016-11-02 DIAGNOSIS — J4541 Moderate persistent asthma with (acute) exacerbation: Secondary | ICD-10-CM | POA: Diagnosis not present

## 2016-11-02 DIAGNOSIS — K625 Hemorrhage of anus and rectum: Secondary | ICD-10-CM | POA: Diagnosis not present

## 2016-11-02 DIAGNOSIS — R05 Cough: Secondary | ICD-10-CM | POA: Diagnosis not present

## 2016-11-07 DIAGNOSIS — J45909 Unspecified asthma, uncomplicated: Secondary | ICD-10-CM | POA: Diagnosis not present

## 2016-11-07 DIAGNOSIS — J4541 Moderate persistent asthma with (acute) exacerbation: Secondary | ICD-10-CM | POA: Diagnosis not present

## 2016-11-14 ENCOUNTER — Ambulatory Visit: Payer: Self-pay | Admitting: Nurse Practitioner

## 2017-04-24 DIAGNOSIS — M71572 Other bursitis, not elsewhere classified, left ankle and foot: Secondary | ICD-10-CM | POA: Diagnosis not present

## 2017-04-24 DIAGNOSIS — M7661 Achilles tendinitis, right leg: Secondary | ICD-10-CM | POA: Diagnosis not present

## 2017-04-24 DIAGNOSIS — B070 Plantar wart: Secondary | ICD-10-CM | POA: Diagnosis not present

## 2017-04-24 DIAGNOSIS — M7662 Achilles tendinitis, left leg: Secondary | ICD-10-CM | POA: Diagnosis not present

## 2017-04-24 DIAGNOSIS — M71571 Other bursitis, not elsewhere classified, right ankle and foot: Secondary | ICD-10-CM | POA: Diagnosis not present

## 2017-05-15 DIAGNOSIS — G4733 Obstructive sleep apnea (adult) (pediatric): Secondary | ICD-10-CM | POA: Diagnosis not present

## 2017-05-15 DIAGNOSIS — F5104 Psychophysiologic insomnia: Secondary | ICD-10-CM | POA: Diagnosis not present

## 2017-05-17 DIAGNOSIS — M71571 Other bursitis, not elsewhere classified, right ankle and foot: Secondary | ICD-10-CM | POA: Diagnosis not present

## 2017-05-17 DIAGNOSIS — M7661 Achilles tendinitis, right leg: Secondary | ICD-10-CM | POA: Diagnosis not present

## 2017-05-17 DIAGNOSIS — B070 Plantar wart: Secondary | ICD-10-CM | POA: Diagnosis not present

## 2017-05-17 DIAGNOSIS — M7662 Achilles tendinitis, left leg: Secondary | ICD-10-CM | POA: Diagnosis not present

## 2017-05-17 DIAGNOSIS — M71572 Other bursitis, not elsewhere classified, left ankle and foot: Secondary | ICD-10-CM | POA: Diagnosis not present

## 2017-07-23 DIAGNOSIS — M545 Low back pain: Secondary | ICD-10-CM | POA: Diagnosis not present

## 2017-07-23 DIAGNOSIS — G43909 Migraine, unspecified, not intractable, without status migrainosus: Secondary | ICD-10-CM | POA: Diagnosis not present

## 2017-07-23 DIAGNOSIS — G4733 Obstructive sleep apnea (adult) (pediatric): Secondary | ICD-10-CM | POA: Diagnosis not present

## 2017-07-23 DIAGNOSIS — F419 Anxiety disorder, unspecified: Secondary | ICD-10-CM | POA: Diagnosis not present

## 2017-07-23 DIAGNOSIS — R609 Edema, unspecified: Secondary | ICD-10-CM | POA: Diagnosis not present

## 2017-07-23 DIAGNOSIS — J45901 Unspecified asthma with (acute) exacerbation: Secondary | ICD-10-CM | POA: Diagnosis not present

## 2017-07-23 DIAGNOSIS — G2581 Restless legs syndrome: Secondary | ICD-10-CM | POA: Diagnosis not present

## 2017-07-23 DIAGNOSIS — F329 Major depressive disorder, single episode, unspecified: Secondary | ICD-10-CM | POA: Diagnosis not present

## 2017-08-08 DIAGNOSIS — R413 Other amnesia: Secondary | ICD-10-CM | POA: Diagnosis not present

## 2017-08-08 DIAGNOSIS — G43709 Chronic migraine without aura, not intractable, without status migrainosus: Secondary | ICD-10-CM | POA: Diagnosis not present

## 2017-08-08 DIAGNOSIS — F329 Major depressive disorder, single episode, unspecified: Secondary | ICD-10-CM | POA: Diagnosis not present

## 2017-08-30 DIAGNOSIS — F329 Major depressive disorder, single episode, unspecified: Secondary | ICD-10-CM | POA: Diagnosis not present

## 2017-08-30 DIAGNOSIS — M545 Low back pain: Secondary | ICD-10-CM | POA: Diagnosis not present

## 2017-08-30 DIAGNOSIS — R609 Edema, unspecified: Secondary | ICD-10-CM | POA: Diagnosis not present

## 2017-08-30 DIAGNOSIS — G43909 Migraine, unspecified, not intractable, without status migrainosus: Secondary | ICD-10-CM | POA: Diagnosis not present

## 2017-08-30 DIAGNOSIS — J45901 Unspecified asthma with (acute) exacerbation: Secondary | ICD-10-CM | POA: Diagnosis not present

## 2017-08-30 DIAGNOSIS — G2581 Restless legs syndrome: Secondary | ICD-10-CM | POA: Diagnosis not present

## 2017-08-30 DIAGNOSIS — F419 Anxiety disorder, unspecified: Secondary | ICD-10-CM | POA: Diagnosis not present

## 2017-08-30 DIAGNOSIS — G4733 Obstructive sleep apnea (adult) (pediatric): Secondary | ICD-10-CM | POA: Diagnosis not present

## 2017-09-05 DIAGNOSIS — E669 Obesity, unspecified: Secondary | ICD-10-CM | POA: Diagnosis not present

## 2017-10-09 DIAGNOSIS — M545 Low back pain: Secondary | ICD-10-CM | POA: Diagnosis not present

## 2017-10-09 DIAGNOSIS — F419 Anxiety disorder, unspecified: Secondary | ICD-10-CM | POA: Diagnosis not present

## 2017-10-09 DIAGNOSIS — J209 Acute bronchitis, unspecified: Secondary | ICD-10-CM | POA: Diagnosis not present

## 2017-10-09 DIAGNOSIS — J45901 Unspecified asthma with (acute) exacerbation: Secondary | ICD-10-CM | POA: Diagnosis not present

## 2017-10-09 DIAGNOSIS — G43909 Migraine, unspecified, not intractable, without status migrainosus: Secondary | ICD-10-CM | POA: Diagnosis not present

## 2017-10-09 DIAGNOSIS — G4733 Obstructive sleep apnea (adult) (pediatric): Secondary | ICD-10-CM | POA: Diagnosis not present

## 2017-10-09 DIAGNOSIS — R609 Edema, unspecified: Secondary | ICD-10-CM | POA: Diagnosis not present

## 2017-10-09 DIAGNOSIS — G2581 Restless legs syndrome: Secondary | ICD-10-CM | POA: Diagnosis not present

## 2017-10-09 DIAGNOSIS — F329 Major depressive disorder, single episode, unspecified: Secondary | ICD-10-CM | POA: Diagnosis not present

## 2017-10-09 DIAGNOSIS — M722 Plantar fascial fibromatosis: Secondary | ICD-10-CM | POA: Diagnosis not present

## 2017-11-01 DIAGNOSIS — M79672 Pain in left foot: Secondary | ICD-10-CM | POA: Diagnosis not present

## 2017-11-01 DIAGNOSIS — M79671 Pain in right foot: Secondary | ICD-10-CM | POA: Diagnosis not present

## 2018-01-08 DIAGNOSIS — E669 Obesity, unspecified: Secondary | ICD-10-CM | POA: Diagnosis not present

## 2018-01-08 DIAGNOSIS — F329 Major depressive disorder, single episode, unspecified: Secondary | ICD-10-CM | POA: Diagnosis not present

## 2018-01-08 DIAGNOSIS — G43909 Migraine, unspecified, not intractable, without status migrainosus: Secondary | ICD-10-CM | POA: Diagnosis not present

## 2018-01-08 DIAGNOSIS — G2581 Restless legs syndrome: Secondary | ICD-10-CM | POA: Diagnosis not present

## 2018-01-08 DIAGNOSIS — M722 Plantar fascial fibromatosis: Secondary | ICD-10-CM | POA: Diagnosis not present

## 2018-01-08 DIAGNOSIS — J209 Acute bronchitis, unspecified: Secondary | ICD-10-CM | POA: Diagnosis not present

## 2018-01-08 DIAGNOSIS — M79601 Pain in right arm: Secondary | ICD-10-CM | POA: Diagnosis not present

## 2018-01-08 DIAGNOSIS — G4733 Obstructive sleep apnea (adult) (pediatric): Secondary | ICD-10-CM | POA: Diagnosis not present

## 2018-01-08 DIAGNOSIS — R609 Edema, unspecified: Secondary | ICD-10-CM | POA: Diagnosis not present

## 2018-01-08 DIAGNOSIS — M545 Low back pain: Secondary | ICD-10-CM | POA: Diagnosis not present

## 2018-01-08 DIAGNOSIS — F419 Anxiety disorder, unspecified: Secondary | ICD-10-CM | POA: Diagnosis not present

## 2018-02-13 DIAGNOSIS — H16203 Unspecified keratoconjunctivitis, bilateral: Secondary | ICD-10-CM | POA: Diagnosis not present

## 2018-02-14 DIAGNOSIS — E669 Obesity, unspecified: Secondary | ICD-10-CM | POA: Diagnosis not present

## 2018-02-19 DIAGNOSIS — G629 Polyneuropathy, unspecified: Secondary | ICD-10-CM | POA: Diagnosis not present

## 2018-02-20 DIAGNOSIS — H16203 Unspecified keratoconjunctivitis, bilateral: Secondary | ICD-10-CM | POA: Diagnosis not present

## 2018-03-20 DIAGNOSIS — Z6841 Body Mass Index (BMI) 40.0 and over, adult: Secondary | ICD-10-CM | POA: Diagnosis not present

## 2018-03-20 DIAGNOSIS — M545 Low back pain: Secondary | ICD-10-CM | POA: Diagnosis not present

## 2018-03-20 DIAGNOSIS — G2581 Restless legs syndrome: Secondary | ICD-10-CM | POA: Diagnosis not present

## 2018-03-20 DIAGNOSIS — E669 Obesity, unspecified: Secondary | ICD-10-CM | POA: Diagnosis not present

## 2018-03-20 DIAGNOSIS — F329 Major depressive disorder, single episode, unspecified: Secondary | ICD-10-CM | POA: Diagnosis not present

## 2018-03-20 DIAGNOSIS — F419 Anxiety disorder, unspecified: Secondary | ICD-10-CM | POA: Diagnosis not present

## 2018-03-20 DIAGNOSIS — M722 Plantar fascial fibromatosis: Secondary | ICD-10-CM | POA: Diagnosis not present

## 2018-03-20 DIAGNOSIS — G43909 Migraine, unspecified, not intractable, without status migrainosus: Secondary | ICD-10-CM | POA: Diagnosis not present

## 2018-03-20 DIAGNOSIS — M79601 Pain in right arm: Secondary | ICD-10-CM | POA: Diagnosis not present

## 2018-03-20 DIAGNOSIS — G5691 Unspecified mononeuropathy of right upper limb: Secondary | ICD-10-CM | POA: Diagnosis not present

## 2018-04-08 DIAGNOSIS — E669 Obesity, unspecified: Secondary | ICD-10-CM | POA: Diagnosis not present

## 2018-05-27 DIAGNOSIS — E669 Obesity, unspecified: Secondary | ICD-10-CM | POA: Diagnosis not present

## 2018-07-12 DIAGNOSIS — G4733 Obstructive sleep apnea (adult) (pediatric): Secondary | ICD-10-CM | POA: Diagnosis not present

## 2018-08-26 DIAGNOSIS — M7662 Achilles tendinitis, left leg: Secondary | ICD-10-CM | POA: Diagnosis not present

## 2018-08-26 DIAGNOSIS — M7661 Achilles tendinitis, right leg: Secondary | ICD-10-CM | POA: Diagnosis not present

## 2018-08-26 DIAGNOSIS — M79671 Pain in right foot: Secondary | ICD-10-CM | POA: Diagnosis not present

## 2018-08-26 DIAGNOSIS — M79672 Pain in left foot: Secondary | ICD-10-CM | POA: Diagnosis not present

## 2018-10-11 DIAGNOSIS — G4733 Obstructive sleep apnea (adult) (pediatric): Secondary | ICD-10-CM | POA: Diagnosis not present

## 2019-02-11 DIAGNOSIS — E669 Obesity, unspecified: Secondary | ICD-10-CM | POA: Diagnosis not present

## 2019-02-11 DIAGNOSIS — Z79899 Other long term (current) drug therapy: Secondary | ICD-10-CM | POA: Diagnosis not present

## 2019-02-11 DIAGNOSIS — G43909 Migraine, unspecified, not intractable, without status migrainosus: Secondary | ICD-10-CM | POA: Diagnosis not present

## 2019-02-11 DIAGNOSIS — F419 Anxiety disorder, unspecified: Secondary | ICD-10-CM | POA: Diagnosis not present

## 2019-02-11 DIAGNOSIS — G2581 Restless legs syndrome: Secondary | ICD-10-CM | POA: Diagnosis not present

## 2019-02-11 DIAGNOSIS — F329 Major depressive disorder, single episode, unspecified: Secondary | ICD-10-CM | POA: Diagnosis not present

## 2019-02-11 DIAGNOSIS — M722 Plantar fascial fibromatosis: Secondary | ICD-10-CM | POA: Diagnosis not present

## 2019-02-11 DIAGNOSIS — G5691 Unspecified mononeuropathy of right upper limb: Secondary | ICD-10-CM | POA: Diagnosis not present

## 2019-02-12 DIAGNOSIS — M7661 Achilles tendinitis, right leg: Secondary | ICD-10-CM | POA: Diagnosis not present

## 2019-02-12 DIAGNOSIS — M7732 Calcaneal spur, left foot: Secondary | ICD-10-CM | POA: Diagnosis not present

## 2019-02-12 DIAGNOSIS — M7731 Calcaneal spur, right foot: Secondary | ICD-10-CM | POA: Diagnosis not present

## 2019-02-12 DIAGNOSIS — M7662 Achilles tendinitis, left leg: Secondary | ICD-10-CM | POA: Diagnosis not present

## 2019-02-15 DIAGNOSIS — Z20822 Contact with and (suspected) exposure to covid-19: Secondary | ICD-10-CM | POA: Diagnosis not present

## 2019-02-15 DIAGNOSIS — M7731 Calcaneal spur, right foot: Secondary | ICD-10-CM | POA: Diagnosis not present

## 2019-02-15 DIAGNOSIS — Z01812 Encounter for preprocedural laboratory examination: Secondary | ICD-10-CM | POA: Diagnosis not present

## 2019-02-15 DIAGNOSIS — M7661 Achilles tendinitis, right leg: Secondary | ICD-10-CM | POA: Diagnosis not present

## 2019-02-19 DIAGNOSIS — M7661 Achilles tendinitis, right leg: Secondary | ICD-10-CM | POA: Diagnosis not present

## 2019-02-19 DIAGNOSIS — Z6839 Body mass index (BMI) 39.0-39.9, adult: Secondary | ICD-10-CM | POA: Diagnosis not present

## 2019-02-19 DIAGNOSIS — M7662 Achilles tendinitis, left leg: Secondary | ICD-10-CM | POA: Diagnosis not present

## 2019-02-19 DIAGNOSIS — F329 Major depressive disorder, single episode, unspecified: Secondary | ICD-10-CM | POA: Diagnosis not present

## 2019-02-19 DIAGNOSIS — J45909 Unspecified asthma, uncomplicated: Secondary | ICD-10-CM | POA: Diagnosis not present

## 2019-02-19 DIAGNOSIS — Z79899 Other long term (current) drug therapy: Secondary | ICD-10-CM | POA: Diagnosis not present

## 2019-02-19 DIAGNOSIS — M7731 Calcaneal spur, right foot: Secondary | ICD-10-CM | POA: Diagnosis not present

## 2019-02-19 DIAGNOSIS — E669 Obesity, unspecified: Secondary | ICD-10-CM | POA: Diagnosis not present

## 2019-02-19 DIAGNOSIS — K219 Gastro-esophageal reflux disease without esophagitis: Secondary | ICD-10-CM | POA: Diagnosis not present

## 2019-02-19 DIAGNOSIS — G4730 Sleep apnea, unspecified: Secondary | ICD-10-CM | POA: Diagnosis not present

## 2019-02-19 DIAGNOSIS — Z885 Allergy status to narcotic agent status: Secondary | ICD-10-CM | POA: Diagnosis not present

## 2019-02-19 DIAGNOSIS — M7732 Calcaneal spur, left foot: Secondary | ICD-10-CM | POA: Diagnosis not present

## 2019-02-26 DIAGNOSIS — M7662 Achilles tendinitis, left leg: Secondary | ICD-10-CM | POA: Diagnosis not present

## 2019-03-28 DIAGNOSIS — G4733 Obstructive sleep apnea (adult) (pediatric): Secondary | ICD-10-CM | POA: Diagnosis not present

## 2019-04-18 DIAGNOSIS — M25472 Effusion, left ankle: Secondary | ICD-10-CM | POA: Diagnosis not present

## 2019-04-18 DIAGNOSIS — M25672 Stiffness of left ankle, not elsewhere classified: Secondary | ICD-10-CM | POA: Diagnosis not present

## 2019-04-18 DIAGNOSIS — M7662 Achilles tendinitis, left leg: Secondary | ICD-10-CM | POA: Diagnosis not present

## 2019-04-18 DIAGNOSIS — M25572 Pain in left ankle and joints of left foot: Secondary | ICD-10-CM | POA: Diagnosis not present

## 2019-04-18 DIAGNOSIS — Z5189 Encounter for other specified aftercare: Secondary | ICD-10-CM | POA: Diagnosis not present

## 2019-04-22 DIAGNOSIS — M25472 Effusion, left ankle: Secondary | ICD-10-CM | POA: Diagnosis not present

## 2019-04-22 DIAGNOSIS — M25672 Stiffness of left ankle, not elsewhere classified: Secondary | ICD-10-CM | POA: Diagnosis not present

## 2019-04-22 DIAGNOSIS — Z5189 Encounter for other specified aftercare: Secondary | ICD-10-CM | POA: Diagnosis not present

## 2019-04-22 DIAGNOSIS — M7662 Achilles tendinitis, left leg: Secondary | ICD-10-CM | POA: Diagnosis not present

## 2019-04-22 DIAGNOSIS — M25572 Pain in left ankle and joints of left foot: Secondary | ICD-10-CM | POA: Diagnosis not present

## 2019-04-24 DIAGNOSIS — M25572 Pain in left ankle and joints of left foot: Secondary | ICD-10-CM | POA: Diagnosis not present

## 2019-04-24 DIAGNOSIS — Z5189 Encounter for other specified aftercare: Secondary | ICD-10-CM | POA: Diagnosis not present

## 2019-04-24 DIAGNOSIS — M25472 Effusion, left ankle: Secondary | ICD-10-CM | POA: Diagnosis not present

## 2019-04-24 DIAGNOSIS — M25672 Stiffness of left ankle, not elsewhere classified: Secondary | ICD-10-CM | POA: Diagnosis not present

## 2019-04-24 DIAGNOSIS — M7662 Achilles tendinitis, left leg: Secondary | ICD-10-CM | POA: Diagnosis not present

## 2019-04-29 DIAGNOSIS — M25472 Effusion, left ankle: Secondary | ICD-10-CM | POA: Diagnosis not present

## 2019-04-29 DIAGNOSIS — M7662 Achilles tendinitis, left leg: Secondary | ICD-10-CM | POA: Diagnosis not present

## 2019-04-29 DIAGNOSIS — M25672 Stiffness of left ankle, not elsewhere classified: Secondary | ICD-10-CM | POA: Diagnosis not present

## 2019-04-29 DIAGNOSIS — M25572 Pain in left ankle and joints of left foot: Secondary | ICD-10-CM | POA: Diagnosis not present

## 2019-04-29 DIAGNOSIS — Z5189 Encounter for other specified aftercare: Secondary | ICD-10-CM | POA: Diagnosis not present

## 2019-05-07 DIAGNOSIS — M25672 Stiffness of left ankle, not elsewhere classified: Secondary | ICD-10-CM | POA: Diagnosis not present

## 2019-05-07 DIAGNOSIS — M25472 Effusion, left ankle: Secondary | ICD-10-CM | POA: Diagnosis not present

## 2019-05-07 DIAGNOSIS — M7662 Achilles tendinitis, left leg: Secondary | ICD-10-CM | POA: Diagnosis not present

## 2019-05-07 DIAGNOSIS — M25572 Pain in left ankle and joints of left foot: Secondary | ICD-10-CM | POA: Diagnosis not present

## 2019-05-07 DIAGNOSIS — Z5189 Encounter for other specified aftercare: Secondary | ICD-10-CM | POA: Diagnosis not present

## 2019-05-09 DIAGNOSIS — Z5189 Encounter for other specified aftercare: Secondary | ICD-10-CM | POA: Diagnosis not present

## 2019-05-09 DIAGNOSIS — M25672 Stiffness of left ankle, not elsewhere classified: Secondary | ICD-10-CM | POA: Diagnosis not present

## 2019-05-09 DIAGNOSIS — M7662 Achilles tendinitis, left leg: Secondary | ICD-10-CM | POA: Diagnosis not present

## 2019-05-09 DIAGNOSIS — M25572 Pain in left ankle and joints of left foot: Secondary | ICD-10-CM | POA: Diagnosis not present

## 2019-05-09 DIAGNOSIS — M25472 Effusion, left ankle: Secondary | ICD-10-CM | POA: Diagnosis not present

## 2019-05-20 DIAGNOSIS — M25572 Pain in left ankle and joints of left foot: Secondary | ICD-10-CM | POA: Diagnosis not present

## 2019-05-20 DIAGNOSIS — M25672 Stiffness of left ankle, not elsewhere classified: Secondary | ICD-10-CM | POA: Diagnosis not present

## 2019-05-20 DIAGNOSIS — M7662 Achilles tendinitis, left leg: Secondary | ICD-10-CM | POA: Diagnosis not present

## 2019-05-20 DIAGNOSIS — Z5189 Encounter for other specified aftercare: Secondary | ICD-10-CM | POA: Diagnosis not present

## 2019-05-20 DIAGNOSIS — M25472 Effusion, left ankle: Secondary | ICD-10-CM | POA: Diagnosis not present

## 2019-05-29 DIAGNOSIS — M25472 Effusion, left ankle: Secondary | ICD-10-CM | POA: Diagnosis not present

## 2019-05-29 DIAGNOSIS — M7662 Achilles tendinitis, left leg: Secondary | ICD-10-CM | POA: Diagnosis not present

## 2019-05-29 DIAGNOSIS — Z5189 Encounter for other specified aftercare: Secondary | ICD-10-CM | POA: Diagnosis not present

## 2019-05-29 DIAGNOSIS — M25572 Pain in left ankle and joints of left foot: Secondary | ICD-10-CM | POA: Diagnosis not present

## 2019-05-29 DIAGNOSIS — M25672 Stiffness of left ankle, not elsewhere classified: Secondary | ICD-10-CM | POA: Diagnosis not present

## 2019-06-03 DIAGNOSIS — M722 Plantar fascial fibromatosis: Secondary | ICD-10-CM | POA: Diagnosis not present

## 2019-06-03 DIAGNOSIS — M7662 Achilles tendinitis, left leg: Secondary | ICD-10-CM | POA: Diagnosis not present

## 2019-06-03 DIAGNOSIS — M7732 Calcaneal spur, left foot: Secondary | ICD-10-CM | POA: Diagnosis not present

## 2019-06-26 DIAGNOSIS — G4733 Obstructive sleep apnea (adult) (pediatric): Secondary | ICD-10-CM | POA: Diagnosis not present

## 2019-08-05 DIAGNOSIS — M7662 Achilles tendinitis, left leg: Secondary | ICD-10-CM | POA: Diagnosis not present

## 2019-08-05 DIAGNOSIS — M722 Plantar fascial fibromatosis: Secondary | ICD-10-CM | POA: Diagnosis not present

## 2019-08-05 DIAGNOSIS — M7732 Calcaneal spur, left foot: Secondary | ICD-10-CM | POA: Diagnosis not present

## 2019-09-02 DIAGNOSIS — Z79899 Other long term (current) drug therapy: Secondary | ICD-10-CM | POA: Diagnosis not present

## 2019-09-02 DIAGNOSIS — M722 Plantar fascial fibromatosis: Secondary | ICD-10-CM | POA: Diagnosis not present

## 2019-09-02 DIAGNOSIS — G4733 Obstructive sleep apnea (adult) (pediatric): Secondary | ICD-10-CM | POA: Diagnosis not present

## 2019-09-02 DIAGNOSIS — E559 Vitamin D deficiency, unspecified: Secondary | ICD-10-CM | POA: Diagnosis not present

## 2019-09-02 DIAGNOSIS — G2581 Restless legs syndrome: Secondary | ICD-10-CM | POA: Diagnosis not present

## 2019-09-02 DIAGNOSIS — G5691 Unspecified mononeuropathy of right upper limb: Secondary | ICD-10-CM | POA: Diagnosis not present

## 2019-09-02 DIAGNOSIS — F329 Major depressive disorder, single episode, unspecified: Secondary | ICD-10-CM | POA: Diagnosis not present

## 2019-09-02 DIAGNOSIS — F419 Anxiety disorder, unspecified: Secondary | ICD-10-CM | POA: Diagnosis not present

## 2019-09-02 DIAGNOSIS — E669 Obesity, unspecified: Secondary | ICD-10-CM | POA: Diagnosis not present

## 2019-09-24 DIAGNOSIS — G4733 Obstructive sleep apnea (adult) (pediatric): Secondary | ICD-10-CM | POA: Diagnosis not present

## 2019-09-25 DIAGNOSIS — R519 Headache, unspecified: Secondary | ICD-10-CM | POA: Diagnosis not present

## 2019-12-24 DIAGNOSIS — G4733 Obstructive sleep apnea (adult) (pediatric): Secondary | ICD-10-CM | POA: Diagnosis not present
